# Patient Record
Sex: Male | Born: 1955 | Race: White | Hispanic: No | Marital: Married | Smoking: Current every day smoker
Health system: Southern US, Community
[De-identification: ages and names within clinical notes are randomized; demographics above are authoritative.]

## PROBLEM LIST (undated history)

## (undated) DIAGNOSIS — T4145XA Adverse effect of unspecified anesthetic, initial encounter: Secondary | ICD-10-CM

## (undated) DIAGNOSIS — I2699 Other pulmonary embolism without acute cor pulmonale: Secondary | ICD-10-CM

## (undated) DIAGNOSIS — M1990 Unspecified osteoarthritis, unspecified site: Secondary | ICD-10-CM

## (undated) DIAGNOSIS — K219 Gastro-esophageal reflux disease without esophagitis: Secondary | ICD-10-CM

## (undated) DIAGNOSIS — O2230 Deep phlebothrombosis in pregnancy, unspecified trimester: Secondary | ICD-10-CM

## (undated) DIAGNOSIS — D649 Anemia, unspecified: Secondary | ICD-10-CM

## (undated) DIAGNOSIS — R079 Chest pain, unspecified: Secondary | ICD-10-CM

## (undated) DIAGNOSIS — E871 Hypo-osmolality and hyponatremia: Secondary | ICD-10-CM

## (undated) DIAGNOSIS — Z9289 Personal history of other medical treatment: Secondary | ICD-10-CM

## (undated) DIAGNOSIS — R55 Syncope and collapse: Secondary | ICD-10-CM

## (undated) DIAGNOSIS — G2581 Restless legs syndrome: Secondary | ICD-10-CM

## (undated) DIAGNOSIS — J45909 Unspecified asthma, uncomplicated: Secondary | ICD-10-CM

## (undated) DIAGNOSIS — N183 Chronic kidney disease, stage 3 unspecified: Secondary | ICD-10-CM

## (undated) DIAGNOSIS — R0602 Shortness of breath: Secondary | ICD-10-CM

## (undated) DIAGNOSIS — I10 Essential (primary) hypertension: Secondary | ICD-10-CM

## (undated) DIAGNOSIS — T8859XA Other complications of anesthesia, initial encounter: Secondary | ICD-10-CM

## (undated) DIAGNOSIS — J189 Pneumonia, unspecified organism: Secondary | ICD-10-CM

## (undated) HISTORY — DX: Hypo-osmolality and hyponatremia: E87.1

## (undated) HISTORY — DX: Syncope and collapse: R55

## (undated) HISTORY — DX: Deep phlebothrombosis in pregnancy, unspecified trimester: O22.30

## (undated) HISTORY — DX: Chronic kidney disease, stage 3 unspecified: N18.30

## (undated) HISTORY — DX: Chronic kidney disease, stage 3 (moderate): N18.3

## (undated) HISTORY — DX: Chest pain, unspecified: R07.9

## (undated) HISTORY — DX: Restless legs syndrome: G25.81

## (undated) HISTORY — PX: KNEE ARTHROSCOPY: SUR90

## (undated) HISTORY — DX: Anemia, unspecified: D64.9

---

## 2005-01-05 ENCOUNTER — Ambulatory Visit (HOSPITAL_COMMUNITY): Admission: RE | Admit: 2005-01-05 | Discharge: 2005-01-05 | Payer: Self-pay | Admitting: Preventative Medicine

## 2005-01-06 ENCOUNTER — Ambulatory Visit (HOSPITAL_COMMUNITY): Admission: RE | Admit: 2005-01-06 | Discharge: 2005-01-06 | Payer: Self-pay | Admitting: Preventative Medicine

## 2005-01-09 ENCOUNTER — Ambulatory Visit (HOSPITAL_COMMUNITY): Admission: RE | Admit: 2005-01-09 | Discharge: 2005-01-09 | Payer: Self-pay | Admitting: Preventative Medicine

## 2011-02-27 ENCOUNTER — Encounter: Payer: Self-pay | Admitting: *Deleted

## 2011-02-27 ENCOUNTER — Emergency Department (HOSPITAL_COMMUNITY)
Admission: EM | Admit: 2011-02-27 | Discharge: 2011-02-27 | Disposition: A | Discharge status: Home / Self Care | Payer: BC Managed Care – PPO | Attending: Emergency Medicine | Admitting: Emergency Medicine

## 2011-02-27 DIAGNOSIS — IMO0001 Reserved for inherently not codable concepts without codable children: Secondary | ICD-10-CM

## 2011-02-27 DIAGNOSIS — Z7982 Long term (current) use of aspirin: Secondary | ICD-10-CM | POA: Insufficient documentation

## 2011-02-27 DIAGNOSIS — L500 Allergic urticaria: Secondary | ICD-10-CM | POA: Insufficient documentation

## 2011-02-27 DIAGNOSIS — F17200 Nicotine dependence, unspecified, uncomplicated: Secondary | ICD-10-CM | POA: Insufficient documentation

## 2011-02-27 HISTORY — DX: Gastro-esophageal reflux disease without esophagitis: K21.9

## 2011-02-27 MED ORDER — PREDNISONE 10 MG PO TABS: 20.0000 mg | ORAL_TABLET | Freq: Every day | ORAL | Status: AC

## 2011-02-27 MED ORDER — ALBUTEROL SULFATE (5 MG/ML) 0.5% IN NEBU
5.0000 mg | INHALATION_SOLUTION | Freq: Once | RESPIRATORY_TRACT | Status: AC
  Administered 2011-02-27: 5 mg via RESPIRATORY_TRACT
  Filled 2011-02-27: qty 1

## 2011-02-27 MED ORDER — DIPHENHYDRAMINE HCL 50 MG/ML IJ SOLN
INTRAMUSCULAR | Status: AC
  Administered 2011-02-27: 50 mg via INTRAVENOUS
  Filled 2011-02-27: qty 1

## 2011-02-27 MED ORDER — SODIUM CHLORIDE 0.9 % IN NEBU
INHALATION_SOLUTION | RESPIRATORY_TRACT | Status: AC
  Administered 2011-02-27: 3 mL
  Filled 2011-02-27: qty 3

## 2011-02-27 MED ORDER — EPINEPHRINE HCL 1 MG/ML IJ SOLN
INTRAMUSCULAR | Status: AC
  Administered 2011-02-27: 0.3 mg via SUBCUTANEOUS
  Filled 2011-02-27: qty 1

## 2011-02-27 MED ORDER — METHYLPREDNISOLONE SODIUM SUCC 125 MG IJ SOLR
INTRAMUSCULAR | Status: AC
  Administered 2011-02-27: 125 mg via INTRAVENOUS
  Filled 2011-02-27: qty 2

## 2011-02-27 MED ORDER — FAMOTIDINE IN NACL 20-0.9 MG/50ML-% IV SOLN
INTRAVENOUS | Status: AC
  Administered 2011-02-27: 02:00:00 via INTRAVENOUS
  Filled 2011-02-27: qty 50

## 2011-02-27 NOTE — ED Provider Notes (Signed)
History     CSN: [id number] Arrival date & time: 02/27/2011  1:16 AM  Chief Complaint  Patient presents with  . Allergic Reaction   HPI Comments: Seen 0127  Patient is a 55 y.o. male presenting with allergic reaction. The history is provided by the patient.  Allergic Reaction The primary symptoms are  wheezing, shortness of breath, rash and urticaria. The current episode started less than 1 hour ago. The problem has not changed since onset. The patient's medical history does not include asthma, COPD or chronic lung disease.  The shortness of breath developed suddenly. The shortness of breath is moderate. The patient's medical history does not include CHF, COPD, asthma or chronic lung disease.  The urticaria began less than 1 hour ago. The urticaria has been gradually worsening since its onset. Urticaria is located on the face, neck, back, chest, left arm, right arm, right leg and left leg.  Associated with: no known exposure or cause.    Past Medical History  Diagnosis Date  . Acid reflux     Past Surgical History  Procedure Date  . Knee arthroscopy     No family history on file.  History  Substance Use Topics  . Smoking status: Current Everyday Smoker    Types: Cigarettes  . Smokeless tobacco: Not on file  . Alcohol Use: No      Review of Systems  Respiratory: Positive for shortness of breath and wheezing.   Skin: Positive for rash.  All other systems reviewed and are negative.    Physical Exam  BP 155/74  Pulse 80  Temp(Src) 98 F (36.7 C) (Oral)  Resp 20  Ht 5\' 10"  (1.778 m)  Wt 212 lb (96.163 kg)  BMI 30.42 kg/m2  SpO2 96%  Physical Exam  Nursing note and vitals reviewed. Constitutional: He is oriented to person, place, and time. He appears well-developed and well-nourished. He appears distressed.  HENT:  Head: Normocephalic and atraumatic.  Eyes: EOM are normal. Pupils are equal, round, and reactive to light.  Neck: Normal range of motion. Neck  supple. No tracheal deviation present.  Cardiovascular: Normal rate, normal heart sounds and intact distal pulses.   Pulmonary/Chest: Effort normal. He has wheezes.  Abdominal: Soft.  Musculoskeletal: Normal range of motion.  Lymphadenopathy:    He has no cervical adenopathy.  Neurological: He is alert and oriented to person, place, and time.  Skin:       Hives to body, with rash, body wide    ED Course  Procedures  MDM  Patient received benedryl, solumedrol, pepcid, and epinephrine upon arrival. He was given albuterol for mild wheezing on his exam. Hives have resolved, subjective feeling of shortness of breah has resolved. Itching has resolved. Reviewed again possible causes/ exposures. He still does not have any recall of unusual exposures/ ingestions, new products.      Nicoletta Dress. Colon Branch, MD 02/27/11 [phone]

## 2011-02-27 NOTE — ED Notes (Signed)
IV d/c'd; catheter intact and site WNL.  Discharge instructions given and reviewed with patient.  Prescription given for Prednisone.  Patient verbalized understanding to take medication as prescribed and to f/u with PMD as needed.  Patient ambulatory with a steady gait; discharged home in good condition.

## 2011-02-27 NOTE — ED Notes (Signed)
Patient states he feels much better, states he is breathing better and now has no itching or burning.

## 2011-07-25 DIAGNOSIS — I2699 Other pulmonary embolism without acute cor pulmonale: Secondary | ICD-10-CM

## 2011-07-25 HISTORY — DX: Other pulmonary embolism without acute cor pulmonale: I26.99

## 2012-01-10 DIAGNOSIS — I8310 Varicose veins of unspecified lower extremity with inflammation: Secondary | ICD-10-CM | POA: Insufficient documentation

## 2012-02-21 ENCOUNTER — Telehealth: Payer: Self-pay

## 2012-02-21 NOTE — Telephone Encounter (Signed)
LMOM for a return call.  

## 2012-02-26 NOTE — Telephone Encounter (Signed)
LMOM to call.

## 2012-02-27 NOTE — Telephone Encounter (Signed)
Letter to pt and PCP.  

## 2012-03-12 ENCOUNTER — Other Ambulatory Visit: Payer: Self-pay

## 2012-03-12 ENCOUNTER — Telehealth: Payer: Self-pay

## 2012-03-12 DIAGNOSIS — Z139 Encounter for screening, unspecified: Secondary | ICD-10-CM

## 2012-03-12 NOTE — Telephone Encounter (Signed)
MOVI PREP SPLIT DOSING, REGULAR BREAKFAST. CLEAR LIQUIDS AFTER 9 AM.  

## 2012-03-12 NOTE — Telephone Encounter (Signed)
Gastroenterology Pre-Procedure Form  Pt's wife gave the info for the triage   Request Date: 03/11/2012     Requesting Physician: Dr. Felecia Shelling     PATIENT INFORMATION:  James Cabrera is a 56 y.o., male (DOB=1956-01-21).  PROCEDURE: Procedure(s) requested: colonoscopy Procedure Reason: screening for colon cancer  PATIENT REVIEW QUESTIONS: The patient reports the following:   1. Diabetes Melitis: no 2. Joint replacements in the past 12 months: no 3. Major health problems in the past 3 months: no 4. Has an artificial valve or MVP:no 5. Has been advised in past to take antibiotics in advance of a procedure like teeth cleaning: no}    MEDICATIONS & ALLERGIES:    Patient reports the following regarding taking any blood thinners:   Plavix? no Aspirin?yes  Coumadin?  no  Patient confirms/reports the following medications:  Current Outpatient Prescriptions  Medication Sig Dispense Refill  . aspirin 81 MG tablet Take 81 mg by mouth daily.        Marland Kitchen losartan-hydrochlorothiazide (HYZAAR) 100-25 MG per tablet Take 1 tablet by mouth daily.      . traMADol-acetaminophen (ULTRACET) 37.5-325 MG per tablet Take 1 tablet by mouth 2 (two) times daily.        Patient confirms/reports the following allergies:  Allergies  Allergen Reactions  . Penicillins     ALMOST DIED/ COULDN'T REMEMBER EXACT PROBLEM    Patient is appropriate to schedule for requested procedure(s): yes  AUTHORIZATION INFORMATION Primary Insurance:   ID #:  Group #:  Pre-Cert / Auth required: Pre-Cert / Auth #:   Secondary Insurance:   ID #:   Group #:  Pre-Cert / Auth required:  Pre-Cert / Auth #:   No orders of the defined types were placed in this encounter.    SCHEDULE INFORMATION: Procedure has been scheduled as follows:  Date: 04/12/2012    Time: 1:15 PM  Location: [facility] Short Stay  This Gastroenterology Pre-Precedure Form is being routed to the following provider(s) for review: Jonette Eva,  MD

## 2012-03-13 MED ORDER — PEG-KCL-NACL-NASULF-NA ASC-C 100 G PO SOLR: 1.0000 | ORAL | Status: DC

## 2012-03-13 NOTE — Telephone Encounter (Signed)
Rx sent to [facility]. Instructions mailed to pt.

## 2012-03-28 ENCOUNTER — Telehealth: Payer: Self-pay | Admitting: Gastroenterology

## 2012-03-28 NOTE — Telephone Encounter (Signed)
Pt's wife called and said that someone had called yesterday and LM about needing the patients medications. Wife said she had went over the medicines during triage and thought maybe it was regarding his prep. Please return their call to [phone] Pt is scheduled for 9/20

## 2012-03-28 NOTE — Telephone Encounter (Signed)
Spoke with pt's wife. She is aware Rx sent to pharmacy.

## 2012-04-08 ENCOUNTER — Telehealth: Payer: Self-pay

## 2012-04-08 NOTE — Telephone Encounter (Signed)
Pt's wife called to cancel his colonoscopy on Friday 04/12/2012 with Dr. Darrick Penna. He is in [facility] with a blood clot in his lung. They will call back to reschedule when he is better.

## 2012-04-08 NOTE — Telephone Encounter (Signed)
Selena Batten was informed in Endo.

## 2012-04-08 NOTE — Telephone Encounter (Signed)
REVIEWED.  

## 2012-04-12 ENCOUNTER — Ambulatory Visit (HOSPITAL_COMMUNITY)
Admission: RE | Admit: 2012-04-12 | Payer: BC Managed Care – PPO | Source: Ambulatory Visit | Admitting: Gastroenterology

## 2012-04-12 ENCOUNTER — Encounter (HOSPITAL_COMMUNITY): Admission: RE | Payer: Self-pay | Source: Ambulatory Visit

## 2012-04-12 SURGERY — COLONOSCOPY
Anesthesia: Moderate Sedation

## 2012-04-15 LAB — PROTIME-INR

## 2012-04-17 ENCOUNTER — Ambulatory Visit (INDEPENDENT_AMBULATORY_CARE_PROVIDER_SITE_OTHER): Payer: BC Managed Care – PPO | Admitting: *Deleted

## 2012-04-17 DIAGNOSIS — I2699 Other pulmonary embolism without acute cor pulmonale: Secondary | ICD-10-CM

## 2012-04-17 DIAGNOSIS — Z7901 Long term (current) use of anticoagulants: Secondary | ICD-10-CM | POA: Insufficient documentation

## 2012-04-17 DIAGNOSIS — I4891 Unspecified atrial fibrillation: Secondary | ICD-10-CM

## 2012-04-17 LAB — POCT INR: INR: 4.2

## 2012-04-22 ENCOUNTER — Ambulatory Visit (INDEPENDENT_AMBULATORY_CARE_PROVIDER_SITE_OTHER): Payer: BC Managed Care – PPO | Admitting: *Deleted

## 2012-04-22 DIAGNOSIS — I4891 Unspecified atrial fibrillation: Secondary | ICD-10-CM

## 2012-04-22 DIAGNOSIS — I2699 Other pulmonary embolism without acute cor pulmonale: Secondary | ICD-10-CM

## 2012-04-22 DIAGNOSIS — Z7901 Long term (current) use of anticoagulants: Secondary | ICD-10-CM

## 2012-04-25 ENCOUNTER — Other Ambulatory Visit: Payer: Self-pay | Admitting: Internal Medicine

## 2012-04-25 MED ORDER — WARFARIN SODIUM 5 MG PO TABS: 5.0000 mg | ORAL_TABLET | ORAL | Status: DC

## 2012-04-25 NOTE — Telephone Encounter (Signed)
PT NEEDS IT TODAY TO [facility] ON [address]

## 2012-04-29 ENCOUNTER — Ambulatory Visit (INDEPENDENT_AMBULATORY_CARE_PROVIDER_SITE_OTHER): Payer: BC Managed Care – PPO | Admitting: *Deleted

## 2012-04-29 ENCOUNTER — Ambulatory Visit (INDEPENDENT_AMBULATORY_CARE_PROVIDER_SITE_OTHER): Payer: BC Managed Care – PPO | Admitting: Internal Medicine

## 2012-04-29 ENCOUNTER — Encounter: Payer: Self-pay | Admitting: Internal Medicine

## 2012-04-29 VITALS — BP 120/78 | HR 83 | Ht 70.0 in | Wt 227.0 lb

## 2012-04-29 DIAGNOSIS — I2699 Other pulmonary embolism without acute cor pulmonale: Secondary | ICD-10-CM

## 2012-04-29 DIAGNOSIS — Z7901 Long term (current) use of anticoagulants: Secondary | ICD-10-CM

## 2012-04-29 DIAGNOSIS — I4891 Unspecified atrial fibrillation: Secondary | ICD-10-CM

## 2012-04-29 LAB — POCT INR: INR: 2.5

## 2012-04-29 NOTE — Progress Notes (Signed)
HPI Patien,t is a 56 year old who follows with Dr. Felecia Shelling.  History of HTN and also DVT in the past.  He was followed by Dewayne Shorter but since he retired he is now  Followed by Dr. Josem Kaufmann at [facility]) for varicose veins. IN mid September the patient developed severe R sided pleuritic CP. Work up signif for PE.  V/Q scan with large defect in RLL and mild to moderate sized defect on L   Patient treated with anticoag as well as empiric ABX  Patient says he is still SOB  Occasional R sided pain that is pleuritic  Mild  Exercising a little on a bike.   Allergies  Allergen Reactions  . Penicillins     ALMOST DIED/ COULDN'T REMEMBER EXACT PROBLEM    Current Outpatient Prescriptions  Medication Sig Dispense Refill  . aspirin 81 MG tablet Take 81 mg by mouth daily.        Marland Kitchen losartan-hydrochlorothiazide (HYZAAR) 100-25 MG per tablet Take 1 tablet by mouth daily.      Marland Kitchen warfarin (COUMADIN) 5 MG tablet Take 1 tablet (5 mg total) by mouth as directed. Taking 2 1/2 tablets daily  30 tablet  2    Past Medical History  Diagnosis Date  . Acid reflux     Past Surgical History  Procedure Date  . Knee arthroscopy     No family history on file.  History   Social History  . Marital Status: Married    Spouse Name: N/A    Number of Children: N/A  . Years of Education: N/A   Occupational History  . Not on file.   Social History Main Topics  . Smoking status: Current Every Day Smoker -- 37 years    Types: Cigarettes  . Smokeless tobacco: Not on file   Comment: 4 cigarettes daily for the past 3 days  . Alcohol Use: No  . Drug Use: No  . Sexually Active: Not on file   Other Topics Concern  . Not on file   Social History Narrative  . No narrative on file    Review of Systems:  All systems reviewed.  They are negative to the above problem except as previously stated.  Vital Signs: BP 120/78  Pulse 83  Ht 5\' 10"  (1.778 m)  Wt 272 lb 6.4 oz (123.56 kg)  BMI 39.09  kg/m2  Physical Exam Patient is in NAD HEENT:  Normocephalic, atraumatic. EOMI, PERRLA.  Neck: JVP is normal.  No bruits.  Lungs: clear to auscultation. No rales no wheezes.  Heart: Regular rate and rhythm. Normal S1, S2. No S3.   No significant murmurs. PMI not displaced.  Abdomen:  Supple, nontender. Normal bowel sounds. No masses. No hepatomegaly.  Extremities:   Good distal pulses throughout. No lower extremity edema.  Musculoskeletal :moving all extremities.  Neuro:   alert and oriented x3.  CN II-XII grossly intact.  EKG:  SR  83  Occasional PVC.  Assessment and Plan:  1.  Pulmonary embolism.  Patient with recent event.  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 Will need life long anticoagulation  With cross bridging as needed.   SOB will take time to improve. 2.  HTN  Adequate control.    Patient to f/u yearly.  F/U Coumadin.  F/U Dr Felecia Shelling  F/U at [facility]

## 2012-04-29 NOTE — Patient Instructions (Addendum)
Your physician recommends that you schedule a follow-up appointment in: 1 year  

## 2012-05-13 ENCOUNTER — Ambulatory Visit (INDEPENDENT_AMBULATORY_CARE_PROVIDER_SITE_OTHER): Payer: BC Managed Care – PPO | Admitting: *Deleted

## 2012-05-13 DIAGNOSIS — I4891 Unspecified atrial fibrillation: Secondary | ICD-10-CM

## 2012-05-13 DIAGNOSIS — Z7901 Long term (current) use of anticoagulants: Secondary | ICD-10-CM

## 2012-05-13 DIAGNOSIS — I2699 Other pulmonary embolism without acute cor pulmonale: Secondary | ICD-10-CM

## 2012-05-20 ENCOUNTER — Telehealth: Payer: Self-pay | Admitting: *Deleted

## 2012-05-20 NOTE — Telephone Encounter (Signed)
LMOM to call. If on coumadin needs OV first.

## 2012-05-20 NOTE — Telephone Encounter (Signed)
Ms Stoklosa called today to set up her husbands colonoscopy appt. Please call her back. Thanks.

## 2012-05-20 NOTE — Telephone Encounter (Signed)
Pt called and said he is on coumadin and he is having a time getting that regulated. He had blood clot in his lung and pneumonia. York Spaniel he has not recoved from that yet. He wants to wait a few weeks and give me a call back.

## 2012-05-23 ENCOUNTER — Ambulatory Visit (INDEPENDENT_AMBULATORY_CARE_PROVIDER_SITE_OTHER): Payer: BC Managed Care – PPO | Admitting: *Deleted

## 2012-05-23 DIAGNOSIS — I2699 Other pulmonary embolism without acute cor pulmonale: Secondary | ICD-10-CM

## 2012-05-23 DIAGNOSIS — Z7901 Long term (current) use of anticoagulants: Secondary | ICD-10-CM

## 2012-05-23 DIAGNOSIS — I4891 Unspecified atrial fibrillation: Secondary | ICD-10-CM

## 2012-05-23 LAB — POCT INR: INR: 1.7

## 2012-06-10 ENCOUNTER — Ambulatory Visit (INDEPENDENT_AMBULATORY_CARE_PROVIDER_SITE_OTHER): Payer: BC Managed Care – PPO | Admitting: *Deleted

## 2012-06-10 DIAGNOSIS — Z7901 Long term (current) use of anticoagulants: Secondary | ICD-10-CM

## 2012-06-10 DIAGNOSIS — I4891 Unspecified atrial fibrillation: Secondary | ICD-10-CM

## 2012-06-10 DIAGNOSIS — I2699 Other pulmonary embolism without acute cor pulmonale: Secondary | ICD-10-CM

## 2012-06-10 LAB — POCT INR: INR: 2.8

## 2012-06-10 NOTE — Telephone Encounter (Signed)
Letter to pt to call when he is ready.

## 2012-06-19 DIAGNOSIS — I82412 Acute embolism and thrombosis of left femoral vein: Secondary | ICD-10-CM | POA: Insufficient documentation

## 2012-06-19 DIAGNOSIS — I82409 Acute embolism and thrombosis of unspecified deep veins of unspecified lower extremity: Secondary | ICD-10-CM | POA: Insufficient documentation

## 2012-06-19 DIAGNOSIS — Z86711 Personal history of pulmonary embolism: Secondary | ICD-10-CM | POA: Insufficient documentation

## 2012-06-28 ENCOUNTER — Other Ambulatory Visit: Payer: Self-pay | Admitting: Cardiology

## 2012-06-28 MED ORDER — WARFARIN SODIUM 5 MG PO TABS: 5.0000 mg | ORAL_TABLET | ORAL | Status: DC

## 2012-07-01 ENCOUNTER — Ambulatory Visit (INDEPENDENT_AMBULATORY_CARE_PROVIDER_SITE_OTHER): Payer: BC Managed Care – PPO | Admitting: *Deleted

## 2012-07-01 DIAGNOSIS — I4891 Unspecified atrial fibrillation: Secondary | ICD-10-CM

## 2012-07-01 DIAGNOSIS — I2699 Other pulmonary embolism without acute cor pulmonale: Secondary | ICD-10-CM

## 2012-07-01 DIAGNOSIS — Z7901 Long term (current) use of anticoagulants: Secondary | ICD-10-CM

## 2012-07-29 ENCOUNTER — Ambulatory Visit (INDEPENDENT_AMBULATORY_CARE_PROVIDER_SITE_OTHER): Payer: BC Managed Care – PPO | Admitting: *Deleted

## 2012-07-29 DIAGNOSIS — I4891 Unspecified atrial fibrillation: Secondary | ICD-10-CM

## 2012-07-29 DIAGNOSIS — I2699 Other pulmonary embolism without acute cor pulmonale: Secondary | ICD-10-CM

## 2012-07-29 DIAGNOSIS — Z7901 Long term (current) use of anticoagulants: Secondary | ICD-10-CM

## 2012-08-26 ENCOUNTER — Ambulatory Visit (INDEPENDENT_AMBULATORY_CARE_PROVIDER_SITE_OTHER): Payer: BC Managed Care – PPO | Admitting: *Deleted

## 2012-08-26 DIAGNOSIS — I2699 Other pulmonary embolism without acute cor pulmonale: Secondary | ICD-10-CM

## 2012-08-26 DIAGNOSIS — I4891 Unspecified atrial fibrillation: Secondary | ICD-10-CM

## 2012-08-26 DIAGNOSIS — Z7901 Long term (current) use of anticoagulants: Secondary | ICD-10-CM

## 2012-09-23 ENCOUNTER — Ambulatory Visit (INDEPENDENT_AMBULATORY_CARE_PROVIDER_SITE_OTHER): Payer: BC Managed Care – PPO | Admitting: *Deleted

## 2012-09-23 ENCOUNTER — Telehealth: Payer: Self-pay | Admitting: *Deleted

## 2012-09-23 DIAGNOSIS — Z7901 Long term (current) use of anticoagulants: Secondary | ICD-10-CM

## 2012-09-23 DIAGNOSIS — I4891 Unspecified atrial fibrillation: Secondary | ICD-10-CM

## 2012-09-23 DIAGNOSIS — I2699 Other pulmonary embolism without acute cor pulmonale: Secondary | ICD-10-CM

## 2012-09-23 NOTE — Telephone Encounter (Signed)
Pt states lisa wanted to know what medication he was put on. Atorvastatin 20mg .

## 2012-09-23 NOTE — Telephone Encounter (Signed)
Atorvastain added to medicine list.

## 2012-10-10 ENCOUNTER — Ambulatory Visit (INDEPENDENT_AMBULATORY_CARE_PROVIDER_SITE_OTHER): Payer: BC Managed Care – PPO | Admitting: *Deleted

## 2012-10-10 DIAGNOSIS — I4891 Unspecified atrial fibrillation: Secondary | ICD-10-CM

## 2012-10-10 DIAGNOSIS — I2699 Other pulmonary embolism without acute cor pulmonale: Secondary | ICD-10-CM

## 2012-10-10 DIAGNOSIS — Z7901 Long term (current) use of anticoagulants: Secondary | ICD-10-CM

## 2012-10-28 ENCOUNTER — Ambulatory Visit (INDEPENDENT_AMBULATORY_CARE_PROVIDER_SITE_OTHER): Payer: BC Managed Care – PPO | Admitting: *Deleted

## 2012-10-28 DIAGNOSIS — I4891 Unspecified atrial fibrillation: Secondary | ICD-10-CM

## 2012-10-28 DIAGNOSIS — Z7901 Long term (current) use of anticoagulants: Secondary | ICD-10-CM

## 2012-10-28 DIAGNOSIS — I2699 Other pulmonary embolism without acute cor pulmonale: Secondary | ICD-10-CM

## 2012-10-28 LAB — POCT INR: INR: 2.6

## 2012-11-18 ENCOUNTER — Other Ambulatory Visit (HOSPITAL_COMMUNITY): Payer: Self-pay | Admitting: Internal Medicine

## 2012-11-18 ENCOUNTER — Ambulatory Visit (HOSPITAL_COMMUNITY)
Admission: RE | Admit: 2012-11-18 | Discharge: 2012-11-18 | Disposition: A | Discharge status: Home / Self Care | Payer: BC Managed Care – PPO | Source: Ambulatory Visit | Attending: Internal Medicine | Admitting: Internal Medicine

## 2012-11-18 DIAGNOSIS — R52 Pain, unspecified: Secondary | ICD-10-CM

## 2012-11-18 DIAGNOSIS — M51379 Other intervertebral disc degeneration, lumbosacral region without mention of lumbar back pain or lower extremity pain: Secondary | ICD-10-CM | POA: Insufficient documentation

## 2012-11-18 DIAGNOSIS — M545 Low back pain, unspecified: Secondary | ICD-10-CM | POA: Insufficient documentation

## 2012-11-18 DIAGNOSIS — G8929 Other chronic pain: Secondary | ICD-10-CM | POA: Insufficient documentation

## 2012-11-18 DIAGNOSIS — M25559 Pain in unspecified hip: Secondary | ICD-10-CM | POA: Insufficient documentation

## 2012-11-18 DIAGNOSIS — M5137 Other intervertebral disc degeneration, lumbosacral region: Secondary | ICD-10-CM | POA: Insufficient documentation

## 2012-11-20 ENCOUNTER — Telehealth: Payer: Self-pay | Admitting: Internal Medicine

## 2012-11-20 MED ORDER — WARFARIN SODIUM 5 MG PO TABS: 5.0000 mg | ORAL_TABLET | ORAL | Status: DC

## 2012-11-20 NOTE — Telephone Encounter (Signed)
rx sent to pharmacy by e-script Pt aware 

## 2012-11-20 NOTE — Telephone Encounter (Signed)
PT IS COMPLETELY OUT OF COUMADIN

## 2012-11-25 ENCOUNTER — Ambulatory Visit (INDEPENDENT_AMBULATORY_CARE_PROVIDER_SITE_OTHER): Payer: BC Managed Care – PPO | Admitting: *Deleted

## 2012-11-25 DIAGNOSIS — I2699 Other pulmonary embolism without acute cor pulmonale: Secondary | ICD-10-CM

## 2012-11-25 DIAGNOSIS — I4891 Unspecified atrial fibrillation: Secondary | ICD-10-CM

## 2012-11-25 DIAGNOSIS — Z7901 Long term (current) use of anticoagulants: Secondary | ICD-10-CM

## 2012-12-23 ENCOUNTER — Ambulatory Visit (INDEPENDENT_AMBULATORY_CARE_PROVIDER_SITE_OTHER): Payer: BC Managed Care – PPO | Admitting: *Deleted

## 2012-12-23 DIAGNOSIS — I4891 Unspecified atrial fibrillation: Secondary | ICD-10-CM

## 2012-12-23 DIAGNOSIS — I2699 Other pulmonary embolism without acute cor pulmonale: Secondary | ICD-10-CM

## 2012-12-23 DIAGNOSIS — Z7901 Long term (current) use of anticoagulants: Secondary | ICD-10-CM

## 2013-01-09 ENCOUNTER — Ambulatory Visit (INDEPENDENT_AMBULATORY_CARE_PROVIDER_SITE_OTHER): Payer: BC Managed Care – PPO | Admitting: *Deleted

## 2013-01-09 DIAGNOSIS — I4891 Unspecified atrial fibrillation: Secondary | ICD-10-CM

## 2013-01-09 DIAGNOSIS — I2699 Other pulmonary embolism without acute cor pulmonale: Secondary | ICD-10-CM

## 2013-01-09 DIAGNOSIS — Z7901 Long term (current) use of anticoagulants: Secondary | ICD-10-CM

## 2013-01-09 LAB — POCT INR: INR: 1.8

## 2013-01-13 ENCOUNTER — Other Ambulatory Visit (HOSPITAL_COMMUNITY): Payer: Self-pay | Admitting: Neurosurgery

## 2013-01-13 DIAGNOSIS — M549 Dorsalgia, unspecified: Secondary | ICD-10-CM

## 2013-01-15 ENCOUNTER — Ambulatory Visit (HOSPITAL_COMMUNITY)
Admission: RE | Admit: 2013-01-15 | Discharge: 2013-01-15 | Disposition: A | Discharge status: Home / Self Care | Payer: BC Managed Care – PPO | Source: Ambulatory Visit | Attending: Neurosurgery | Admitting: Neurosurgery

## 2013-01-15 DIAGNOSIS — M549 Dorsalgia, unspecified: Secondary | ICD-10-CM

## 2013-01-27 ENCOUNTER — Other Ambulatory Visit: Payer: Self-pay | Admitting: Neurosurgery

## 2013-01-27 ENCOUNTER — Ambulatory Visit (INDEPENDENT_AMBULATORY_CARE_PROVIDER_SITE_OTHER): Payer: BC Managed Care – PPO | Admitting: *Deleted

## 2013-01-27 DIAGNOSIS — I2699 Other pulmonary embolism without acute cor pulmonale: Secondary | ICD-10-CM

## 2013-01-27 DIAGNOSIS — Z7901 Long term (current) use of anticoagulants: Secondary | ICD-10-CM

## 2013-01-27 DIAGNOSIS — I4891 Unspecified atrial fibrillation: Secondary | ICD-10-CM

## 2013-01-27 DIAGNOSIS — M549 Dorsalgia, unspecified: Secondary | ICD-10-CM

## 2013-01-27 LAB — POCT INR: INR: 2.4

## 2013-01-30 ENCOUNTER — Telehealth: Payer: Self-pay | Admitting: *Deleted

## 2013-01-30 NOTE — Telephone Encounter (Signed)
Needs ok to stop Coumadin 4 days prior to Myelogram. Has not been scheduled. /t gs

## 2013-01-30 NOTE — Telephone Encounter (Signed)
Called and left message for Duwayne Heck at [facility] that pt will need to be bridged with Lovenox when he comes off coumadin.  Let me know surgical date if we need to assist with bridging.

## 2013-02-05 ENCOUNTER — Ambulatory Visit (INDEPENDENT_AMBULATORY_CARE_PROVIDER_SITE_OTHER): Payer: BC Managed Care – PPO | Admitting: *Deleted

## 2013-02-05 DIAGNOSIS — I2699 Other pulmonary embolism without acute cor pulmonale: Secondary | ICD-10-CM

## 2013-02-05 DIAGNOSIS — I4891 Unspecified atrial fibrillation: Secondary | ICD-10-CM

## 2013-02-05 DIAGNOSIS — Z7901 Long term (current) use of anticoagulants: Secondary | ICD-10-CM

## 2013-02-05 LAB — POCT INR: INR: 2.4

## 2013-02-05 MED ORDER — ENOXAPARIN SODIUM 100 MG/ML ~~LOC~~ SOLN: 100.0000 mg | Freq: Two times a day (BID) | SUBCUTANEOUS | Status: DC

## 2013-02-05 NOTE — Patient Instructions (Signed)
7/15  Last dose of coumadin 7/16  No Lovenox or coumadin 7/17  lovenox 100mg  sq 8am and 8pm 7/18   lovenox 100mg  sq 8am and 8pm 7/19   lovenox 100mg  sq 8am and 8pm 7/20   lovenox 100mg  sq 8am only  No lovenox in pm 7/21  No lovenox ---procedure---coumadin 7.5mg  pm 7/22   lovenox 100mg  sq 8am and 8pm & Coumadin 7.5mg  pm 7/23   lovenox 100mg  sq 8am and 8pm & Coumadin 7.5mg  pm 7/24   lovenox 100mg  sq 8am and INR appt at 10:50am   Lovenox 100mg  syringes  #10 sent to [facility].  Pt has 6 syringes at home left from hospital D/C

## 2013-02-10 ENCOUNTER — Ambulatory Visit
Admission: RE | Admit: 2013-02-10 | Discharge: 2013-02-10 | Disposition: A | Discharge status: Home / Self Care | Payer: BC Managed Care – PPO | Source: Ambulatory Visit | Attending: Neurosurgery | Admitting: Neurosurgery

## 2013-02-10 ENCOUNTER — Ambulatory Visit (INDEPENDENT_AMBULATORY_CARE_PROVIDER_SITE_OTHER): Payer: BC Managed Care – PPO | Admitting: *Deleted

## 2013-02-10 VITALS — BP 137/73 | HR 79

## 2013-02-10 DIAGNOSIS — I4891 Unspecified atrial fibrillation: Secondary | ICD-10-CM

## 2013-02-10 DIAGNOSIS — M549 Dorsalgia, unspecified: Secondary | ICD-10-CM

## 2013-02-10 DIAGNOSIS — I2699 Other pulmonary embolism without acute cor pulmonale: Secondary | ICD-10-CM

## 2013-02-10 DIAGNOSIS — Z7901 Long term (current) use of anticoagulants: Secondary | ICD-10-CM

## 2013-02-10 MED ORDER — DIAZEPAM 5 MG PO TABS
10.0000 mg | ORAL_TABLET | Freq: Once | ORAL | Status: AC
  Administered 2013-02-10: 10 mg via ORAL

## 2013-02-10 MED ORDER — MEPERIDINE HCL 100 MG/ML IJ SOLN
100.0000 mg | Freq: Once | INTRAMUSCULAR | Status: AC
  Administered 2013-02-10: 100 mg via INTRAMUSCULAR

## 2013-02-10 MED ORDER — ONDANSETRON HCL 4 MG/2ML IJ SOLN
4.0000 mg | Freq: Once | INTRAMUSCULAR | Status: AC
  Administered 2013-02-10: 4 mg via INTRAMUSCULAR

## 2013-02-10 MED ORDER — IOHEXOL 180 MG/ML  SOLN
20.0000 mL | Freq: Once | INTRAMUSCULAR | Status: AC | PRN
  Administered 2013-02-10: 20 mL via INTRATHECAL

## 2013-02-10 NOTE — Patient Instructions (Addendum)
02/10/2013 procedure when instructed to restart coumadin and lovenox  Take Lovenox 100 mg at 8am and 8pm each day and resume same coumadin dose except take extra 1/2 tablet for 2 days and continue lovenox and coumadin until seen in clinic on Thursday July 24th

## 2013-02-13 ENCOUNTER — Ambulatory Visit (INDEPENDENT_AMBULATORY_CARE_PROVIDER_SITE_OTHER): Payer: BC Managed Care – PPO | Admitting: *Deleted

## 2013-02-13 DIAGNOSIS — Z7901 Long term (current) use of anticoagulants: Secondary | ICD-10-CM

## 2013-02-13 DIAGNOSIS — I2699 Other pulmonary embolism without acute cor pulmonale: Secondary | ICD-10-CM

## 2013-02-13 DIAGNOSIS — I4891 Unspecified atrial fibrillation: Secondary | ICD-10-CM

## 2013-02-17 ENCOUNTER — Ambulatory Visit (INDEPENDENT_AMBULATORY_CARE_PROVIDER_SITE_OTHER): Payer: BC Managed Care – PPO | Admitting: *Deleted

## 2013-02-17 DIAGNOSIS — I2699 Other pulmonary embolism without acute cor pulmonale: Secondary | ICD-10-CM

## 2013-02-17 DIAGNOSIS — Z7901 Long term (current) use of anticoagulants: Secondary | ICD-10-CM

## 2013-02-17 DIAGNOSIS — I4891 Unspecified atrial fibrillation: Secondary | ICD-10-CM

## 2013-02-19 ENCOUNTER — Ambulatory Visit (INDEPENDENT_AMBULATORY_CARE_PROVIDER_SITE_OTHER): Payer: BC Managed Care – PPO | Admitting: *Deleted

## 2013-02-19 DIAGNOSIS — I4891 Unspecified atrial fibrillation: Secondary | ICD-10-CM

## 2013-02-19 DIAGNOSIS — I2699 Other pulmonary embolism without acute cor pulmonale: Secondary | ICD-10-CM

## 2013-02-19 DIAGNOSIS — Z7901 Long term (current) use of anticoagulants: Secondary | ICD-10-CM

## 2013-02-19 LAB — POCT INR: INR: 2.1

## 2013-02-20 ENCOUNTER — Encounter: Payer: Self-pay | Admitting: Internal Medicine

## 2013-03-06 ENCOUNTER — Other Ambulatory Visit (HOSPITAL_COMMUNITY): Payer: Self-pay | Admitting: Neurosurgery

## 2013-03-06 ENCOUNTER — Other Ambulatory Visit: Payer: Self-pay | Admitting: Neurosurgery

## 2013-03-06 DIAGNOSIS — M549 Dorsalgia, unspecified: Secondary | ICD-10-CM

## 2013-03-10 ENCOUNTER — Ambulatory Visit (INDEPENDENT_AMBULATORY_CARE_PROVIDER_SITE_OTHER): Payer: BC Managed Care – PPO | Admitting: *Deleted

## 2013-03-10 DIAGNOSIS — I2699 Other pulmonary embolism without acute cor pulmonale: Secondary | ICD-10-CM

## 2013-03-10 DIAGNOSIS — Z7901 Long term (current) use of anticoagulants: Secondary | ICD-10-CM

## 2013-03-10 DIAGNOSIS — I4891 Unspecified atrial fibrillation: Secondary | ICD-10-CM

## 2013-03-10 NOTE — Patient Instructions (Signed)
8/19  Last dose of coumadin 8/20  No lovenox or coumadin 8/21  Loveox 100mg  bid 8am & 8pm 8/22  Loveox 100mg  bid 8am & 8pm 8/23  Loveox 100mg  bid 8am & 8pm 8/24  Loveox 100mg  8am only 8/25  No lovenox---spinal injection---coumadin 10mg  pm 8/26 Loveox 100mg  bid 8am & 8pm & coumadin 10mg  pm 8/27  Loveox 100mg  bid 8am & 8pm & coumadin 10mg  pm 8/28  Lovenox 100mg  8am----INR appt @ 10:40am

## 2013-03-17 ENCOUNTER — Other Ambulatory Visit: Payer: Self-pay | Admitting: Neurosurgery

## 2013-03-17 ENCOUNTER — Ambulatory Visit (INDEPENDENT_AMBULATORY_CARE_PROVIDER_SITE_OTHER): Payer: BC Managed Care – PPO | Admitting: *Deleted

## 2013-03-17 ENCOUNTER — Ambulatory Visit
Admission: RE | Admit: 2013-03-17 | Discharge: 2013-03-17 | Disposition: A | Discharge status: Home / Self Care | Payer: BC Managed Care – PPO | Source: Ambulatory Visit | Attending: Neurosurgery | Admitting: Neurosurgery

## 2013-03-17 VITALS — BP 148/78 | HR 72

## 2013-03-17 DIAGNOSIS — I2699 Other pulmonary embolism without acute cor pulmonale: Secondary | ICD-10-CM

## 2013-03-17 DIAGNOSIS — M549 Dorsalgia, unspecified: Secondary | ICD-10-CM

## 2013-03-17 DIAGNOSIS — M5126 Other intervertebral disc displacement, lumbar region: Secondary | ICD-10-CM

## 2013-03-17 DIAGNOSIS — Z7901 Long term (current) use of anticoagulants: Secondary | ICD-10-CM

## 2013-03-17 DIAGNOSIS — I4891 Unspecified atrial fibrillation: Secondary | ICD-10-CM

## 2013-03-17 LAB — POCT INR: INR: 0.9

## 2013-03-17 MED ORDER — METHYLPREDNISOLONE ACETATE 40 MG/ML INJ SUSP (RADIOLOG
120.0000 mg | Freq: Once | INTRAMUSCULAR | Status: AC
  Administered 2013-03-17: 120 mg via INTRA_ARTICULAR

## 2013-03-17 MED ORDER — IOHEXOL 180 MG/ML  SOLN
1.0000 mL | Freq: Once | INTRAMUSCULAR | Status: AC | PRN
  Administered 2013-03-17: 2 mL via INTRA_ARTICULAR

## 2013-03-20 ENCOUNTER — Ambulatory Visit (INDEPENDENT_AMBULATORY_CARE_PROVIDER_SITE_OTHER): Payer: BC Managed Care – PPO | Admitting: *Deleted

## 2013-03-20 DIAGNOSIS — I2699 Other pulmonary embolism without acute cor pulmonale: Secondary | ICD-10-CM

## 2013-03-20 DIAGNOSIS — Z7901 Long term (current) use of anticoagulants: Secondary | ICD-10-CM

## 2013-03-20 DIAGNOSIS — I4891 Unspecified atrial fibrillation: Secondary | ICD-10-CM

## 2013-03-27 ENCOUNTER — Ambulatory Visit (INDEPENDENT_AMBULATORY_CARE_PROVIDER_SITE_OTHER): Payer: BC Managed Care – PPO | Admitting: *Deleted

## 2013-03-27 DIAGNOSIS — I2699 Other pulmonary embolism without acute cor pulmonale: Secondary | ICD-10-CM

## 2013-03-27 DIAGNOSIS — Z7901 Long term (current) use of anticoagulants: Secondary | ICD-10-CM

## 2013-03-27 DIAGNOSIS — I4891 Unspecified atrial fibrillation: Secondary | ICD-10-CM

## 2013-03-27 LAB — POCT INR: INR: 1.6

## 2013-03-31 ENCOUNTER — Ambulatory Visit (INDEPENDENT_AMBULATORY_CARE_PROVIDER_SITE_OTHER): Payer: BC Managed Care – PPO | Admitting: *Deleted

## 2013-03-31 DIAGNOSIS — I2699 Other pulmonary embolism without acute cor pulmonale: Secondary | ICD-10-CM

## 2013-03-31 DIAGNOSIS — I4891 Unspecified atrial fibrillation: Secondary | ICD-10-CM

## 2013-03-31 DIAGNOSIS — Z7901 Long term (current) use of anticoagulants: Secondary | ICD-10-CM

## 2013-03-31 LAB — POCT INR: INR: 2

## 2013-04-11 ENCOUNTER — Other Ambulatory Visit: Payer: Self-pay | Admitting: Neurosurgery

## 2013-04-11 DIAGNOSIS — M549 Dorsalgia, unspecified: Secondary | ICD-10-CM

## 2013-04-14 ENCOUNTER — Ambulatory Visit (INDEPENDENT_AMBULATORY_CARE_PROVIDER_SITE_OTHER): Payer: BC Managed Care – PPO | Admitting: *Deleted

## 2013-04-14 ENCOUNTER — Other Ambulatory Visit: Payer: Self-pay | Admitting: Internal Medicine

## 2013-04-14 DIAGNOSIS — I4891 Unspecified atrial fibrillation: Secondary | ICD-10-CM

## 2013-04-14 DIAGNOSIS — I2699 Other pulmonary embolism without acute cor pulmonale: Secondary | ICD-10-CM

## 2013-04-14 DIAGNOSIS — Z7901 Long term (current) use of anticoagulants: Secondary | ICD-10-CM

## 2013-04-14 LAB — BASIC METABOLIC PANEL
Calcium: 9.8 mg/dL (ref 8.4–10.5)
Glucose, Bld: 92 mg/dL (ref 70–99)
Potassium: 5 mEq/L (ref 3.5–5.3)
Sodium: 139 mEq/L (ref 135–145)

## 2013-04-14 LAB — CBC
Hemoglobin: 15.3 g/dL (ref 13.0–17.0)
MCH: 33.8 pg (ref 26.0–34.0)
MCHC: 33.6 g/dL (ref 30.0–36.0)
Platelets: 239 10*3/uL (ref 150–400)
RBC: 4.53 MIL/uL (ref 4.22–5.81)

## 2013-04-14 LAB — POCT INR: INR: 2

## 2013-04-18 ENCOUNTER — Ambulatory Visit (INDEPENDENT_AMBULATORY_CARE_PROVIDER_SITE_OTHER): Payer: BC Managed Care – PPO | Admitting: *Deleted

## 2013-04-18 DIAGNOSIS — Z7901 Long term (current) use of anticoagulants: Secondary | ICD-10-CM

## 2013-04-18 DIAGNOSIS — I2699 Other pulmonary embolism without acute cor pulmonale: Secondary | ICD-10-CM

## 2013-04-18 NOTE — Patient Instructions (Signed)
Pt was started on Xarelto for pulmonary embolism on 04/18/13.    Reviewed patients medication list.  Pt is not currently on any combined P-gp and strong CYP3A4 inhibitors/inducers (ketoconazole, traconazole, ritonavir, carbamazepine, phenytoin, rifampin, St. John's wort).  Reviewed labs from 04/14/13.  SCr 1.15, Weight 124kg, CrCl- 127.2.  Dose appropriate based on CrCl.   Hgb and HCT from 04/14/13  15.3/45.5.  A full discussion of the nature of anticoagulants has been carried out.  A benefit/risk analysis has been presented to the patient, so that they understand the justification for choosing anticoagulation with Xarelto at this time.  The need for compliance is stressed.  Pt is aware to take the medication once daily with the largest meal of the day.  Side effects of potential bleeding are discussed, including unusual colored urine or stools, coughing up blood or coffee ground emesis, nose bleeds or serious fall or head trauma.  Discussed signs and symptoms of stroke. The patient should avoid any OTC items containing aspirin or ibuprofen.  Avoid alcohol consumption.   Call if any signs of abnormal bleeding.  Discussed financial obligations and resolved any difficulty in obtaining medication.  Next lab test test in 1 month.

## 2013-05-07 ENCOUNTER — Telehealth: Payer: Self-pay | Admitting: *Deleted

## 2013-05-07 NOTE — Telephone Encounter (Signed)
PT IS SCHEDULED TO HAVE INJECTIONS IN BACK ON 05/19/13 AND NEEDS TO KNOW WHEN TO STOP James Cabrera

## 2013-05-08 NOTE — Telephone Encounter (Signed)
Pt told to take last dose of Xarelto on 10/25 for spinal injection on 10/27 and resume night of procedure if OK with MD.  Pt verbalized understanding.

## 2013-05-18 IMAGING — CR DG LUMBAR SPINE COMPLETE 4+V
5 series · 5 of 5 positions shown · non-contrast
Comparison: None.

CLINICAL DATA: Chronic low back pain.

LUMBAR SPINE - COMPLETE 4+ VIEW

[view not recorded (1 of 5)]
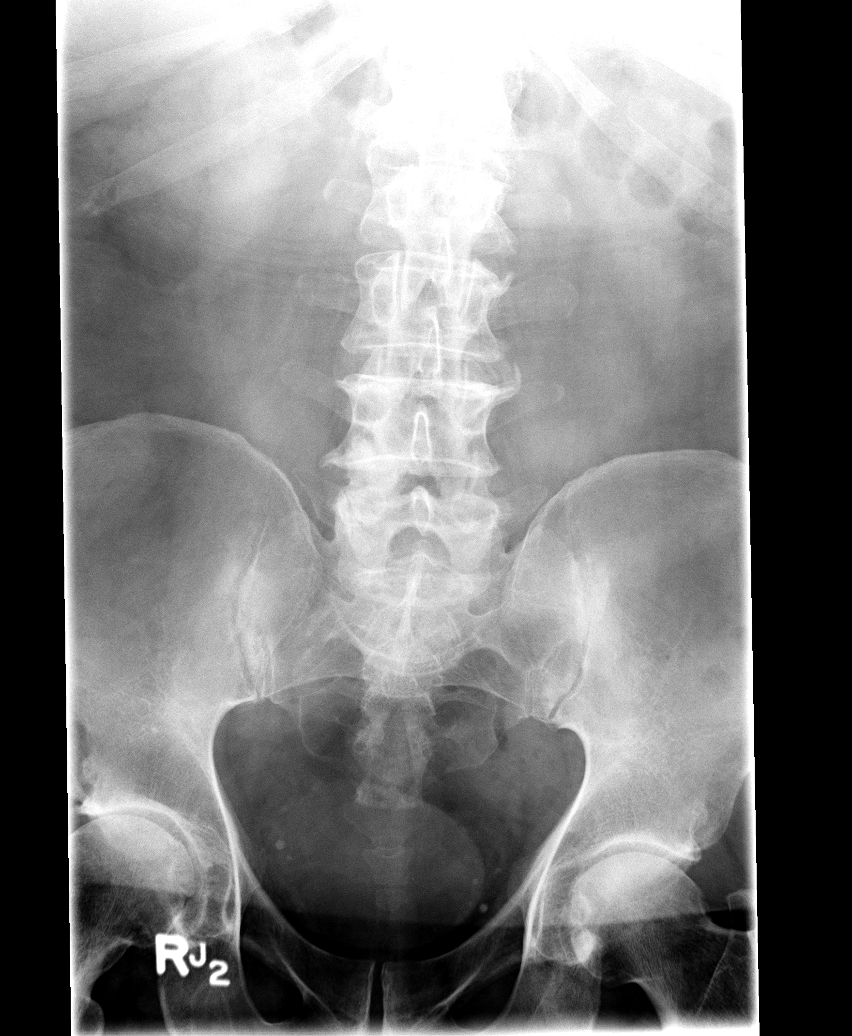

[view not recorded (2 of 5)]
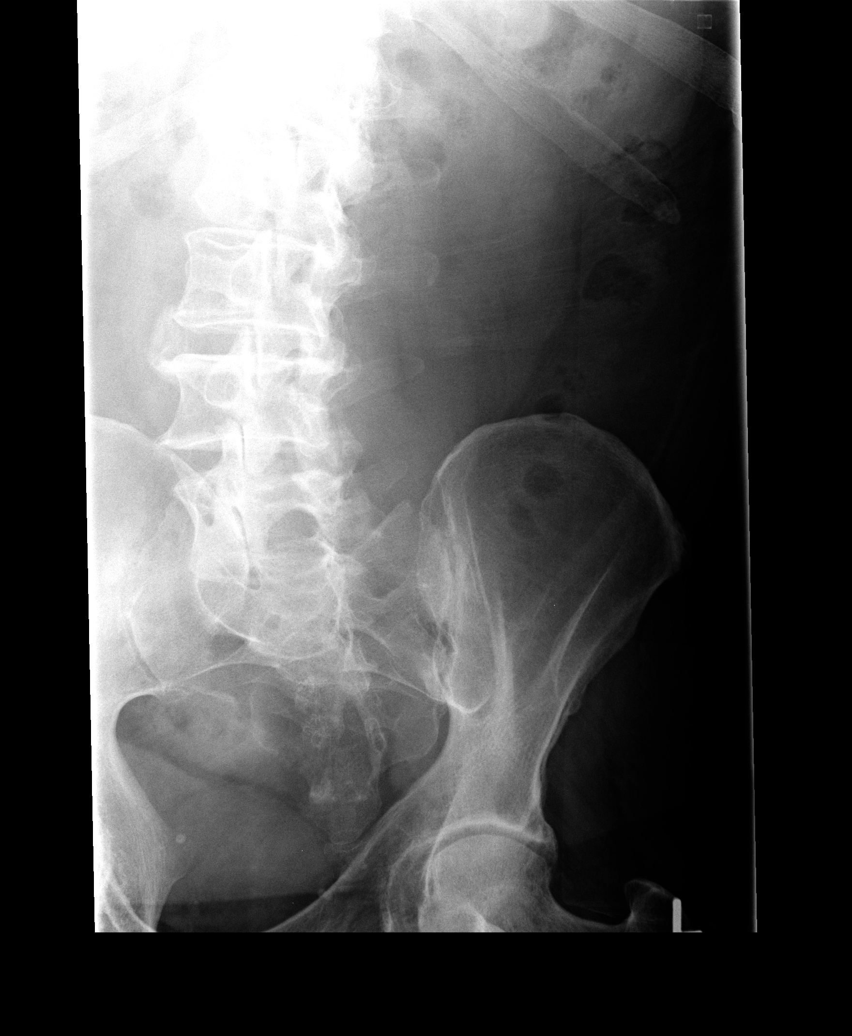

[view not recorded (3 of 5)]
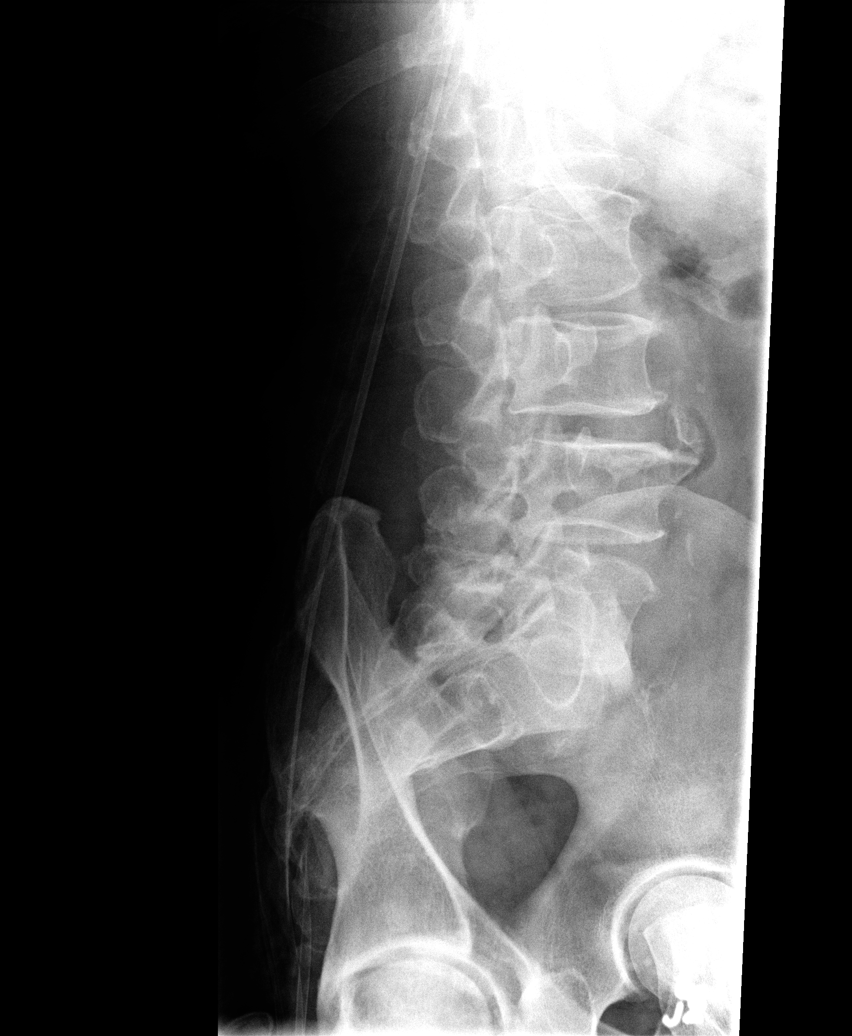

[view not recorded (4 of 5)]
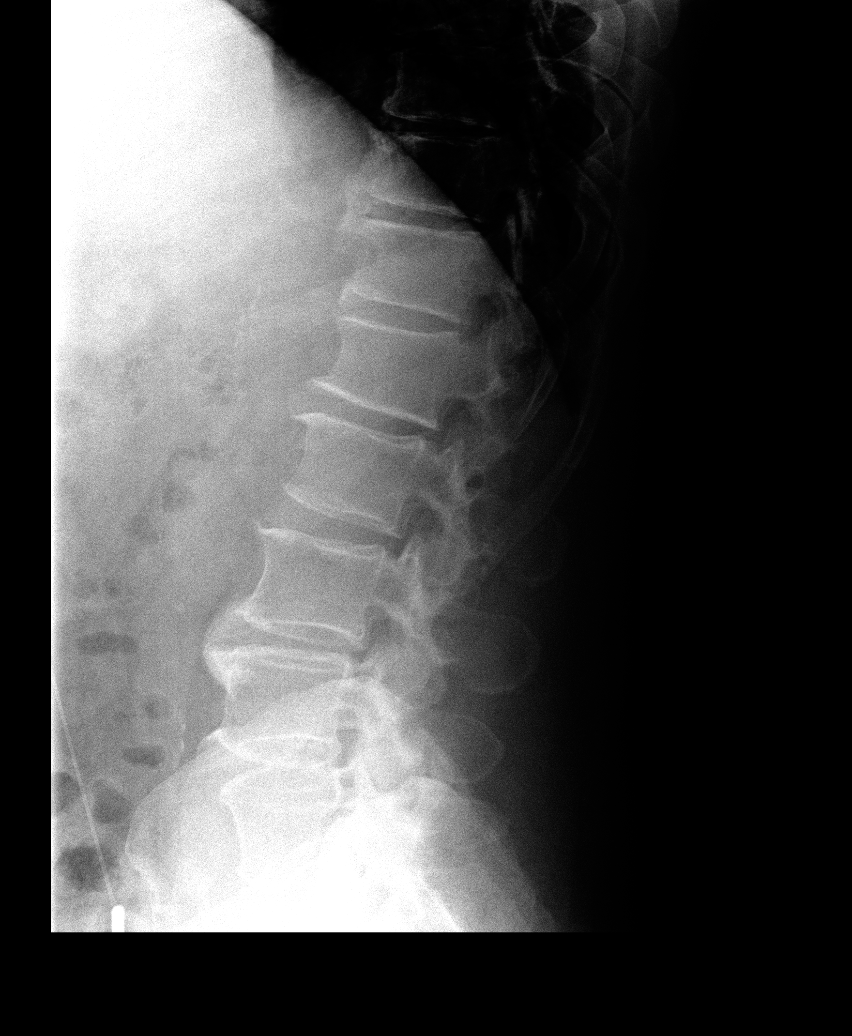

[view not recorded (5 of 5)]
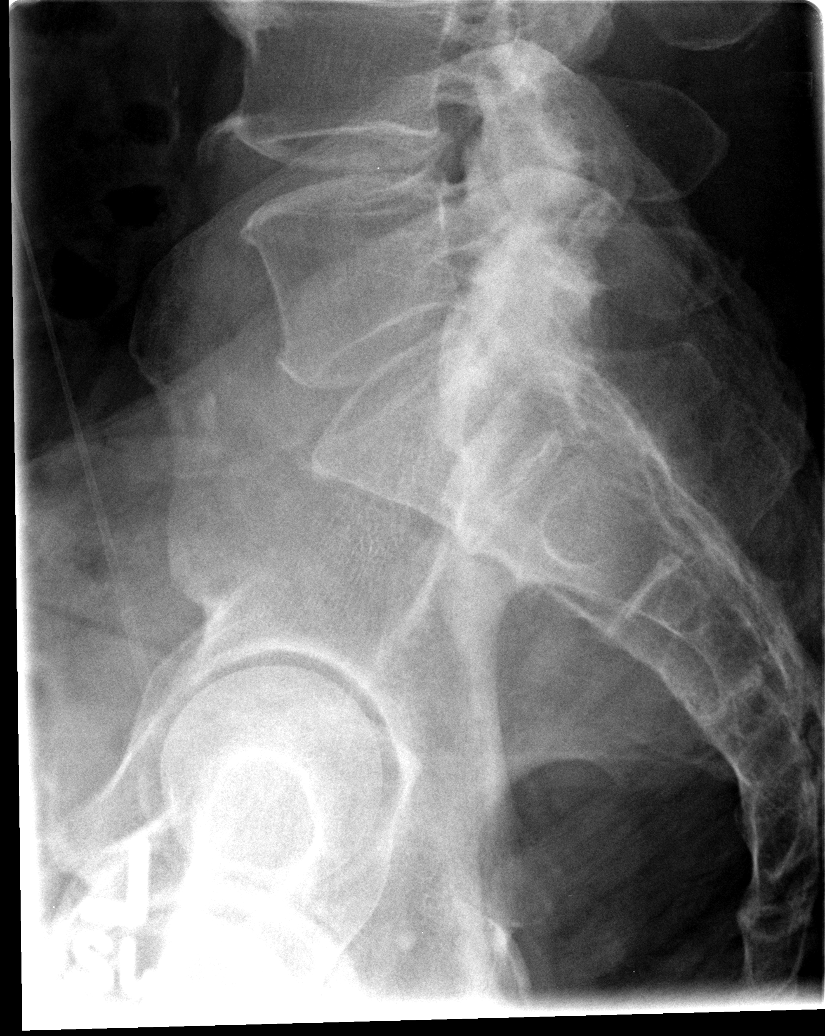

[5 of 5 positions shown; findings below may reference images not displayed]

FINDINGS: Degenerative changes in the lower thoracic spine and mid
lumbar spine with anterior spurring and early disc space narrowing.
Normal alignment.  No fracture SI joints are symmetric and
unremarkable.  No fracture.
IMPRESSION: Degenerative disc changes as above.  No acute findings.

## 2013-05-19 ENCOUNTER — Telehealth: Payer: Self-pay | Admitting: Internal Medicine

## 2013-05-19 ENCOUNTER — Other Ambulatory Visit: Payer: Self-pay | Admitting: Neurosurgery

## 2013-05-19 ENCOUNTER — Ambulatory Visit
Admission: RE | Admit: 2013-05-19 | Discharge: 2013-05-19 | Disposition: A | Discharge status: Home / Self Care | Payer: BC Managed Care – PPO | Source: Ambulatory Visit | Attending: Neurosurgery | Admitting: Neurosurgery

## 2013-05-19 VITALS — BP 182/84 | HR 71

## 2013-05-19 DIAGNOSIS — M549 Dorsalgia, unspecified: Secondary | ICD-10-CM

## 2013-05-19 MED ORDER — IOHEXOL 180 MG/ML  SOLN: 2.0000 mL | Freq: Once | INTRAMUSCULAR | Status: AC | PRN

## 2013-05-19 MED ORDER — RIVAROXABAN 20 MG PO TABS: 20.0000 mg | ORAL_TABLET | Freq: Every day | ORAL | Status: DC

## 2013-05-19 MED ORDER — METHYLPREDNISOLONE ACETATE 40 MG/ML INJ SUSP (RADIOLOG: 120.0000 mg | Freq: Once | INTRAMUSCULAR | Status: DC

## 2013-05-19 NOTE — Telephone Encounter (Signed)
Medication sent via escribe.  

## 2013-05-19 NOTE — Telephone Encounter (Signed)
Needs RX on Xarelto sent to [facility] on [address] / tgs

## 2013-05-22 ENCOUNTER — Ambulatory Visit (INDEPENDENT_AMBULATORY_CARE_PROVIDER_SITE_OTHER): Payer: BC Managed Care – PPO | Admitting: *Deleted

## 2013-05-22 ENCOUNTER — Encounter: Payer: Self-pay | Admitting: Adult Health

## 2013-05-22 ENCOUNTER — Ambulatory Visit (INDEPENDENT_AMBULATORY_CARE_PROVIDER_SITE_OTHER): Payer: BC Managed Care – PPO | Admitting: Adult Health

## 2013-05-22 VITALS — BP 130/78 | HR 80 | Ht 70.0 in | Wt 235.0 lb

## 2013-05-22 DIAGNOSIS — I2699 Other pulmonary embolism without acute cor pulmonale: Secondary | ICD-10-CM

## 2013-05-22 DIAGNOSIS — Z7901 Long term (current) use of anticoagulants: Secondary | ICD-10-CM

## 2013-05-22 DIAGNOSIS — I4891 Unspecified atrial fibrillation: Secondary | ICD-10-CM

## 2013-05-22 LAB — CBC
HCT: 44.1 % (ref 39.0–52.0)
Hemoglobin: 15.4 g/dL (ref 13.0–17.0)
MCH: 34.6 pg — ABNORMAL HIGH (ref 26.0–34.0)
MCHC: 34.9 g/dL (ref 30.0–36.0)
MCV: 99.1 fL (ref 78.0–100.0)
RDW: 13 % (ref 11.5–15.5)

## 2013-05-22 MED ORDER — LOSARTAN POTASSIUM-HCTZ 100-25 MG PO TABS: 1.0000 | ORAL_TABLET | Freq: Every day | ORAL | Status: DC

## 2013-05-22 MED ORDER — ATORVASTATIN CALCIUM 20 MG PO TABS: 20.0000 mg | ORAL_TABLET | Freq: Every day | ORAL | Status: DC

## 2013-05-22 NOTE — Patient Instructions (Addendum)
Pt was started on Xarelto 04/18/13 for Pulmonary Embolism.  Reviewed patients medication list.  Pt is not currently on any combined P-gp and strong CYP3A4 inhibitors/inducers (ketoconazole, traconazole, ritonavir, carbamazepine, phenytoin, rifampin, St. John's wort).  Reviewed labs 10/30 14.  SCr 1.28, Weight 106.5 , CrCl 98.23   Dose  Appropriate based on CrCl.   Hgb and HCT 15.4/44.1 on 05/22/13 and stable.  A full discussion of the nature of anticoagulants has been carried out.  A benefit/risk analysis has been presented to the patient, so that they understand the justification for choosing anticoagulation with Xarelto at this time.  The need for compliance is stressed.  Pt is aware to take the medication once daily with the largest meal of the day.  Side effects of potential bleeding are discussed, including unusual colored urine or stools, coughing up blood or coffee ground emesis, nose bleeds or serious fall or head trauma.  Discussed signs and symptoms of stroke. The patient should avoid any OTC items containing aspirin or ibuprofen.  Avoid alcohol consumption.   Call if any signs of abnormal bleeding.  Discussed financial obligations and resolved any difficulty in obtaining medication.  Next lab test test in 6 months.

## 2013-05-22 NOTE — Assessment & Plan Note (Signed)
He is doing well. Denies bleeding issues with Xarelto.  Will check a CBC and BMET now that he has been on this for one month for kidney fx and for anemia.

## 2013-05-22 NOTE — Patient Instructions (Addendum)
Your physician recommends that you schedule a follow-up appointment in: 1 year with Joni Reining, NP and 6 months with Vashti Hey.  Your physician recommends that you return for lab work this week. BMET, CBC

## 2013-05-22 NOTE — Progress Notes (Deleted)
Name: James Cabrera    DOB: 25-Mar-1956  Age: 57 y.o.  MR#: [id number]       PCP:  Avon Gully, MD      Insurance: Payor: BLUE CROSS BLUE SHIELD / Plan: BCBS [place] PPO / Product Type: *No Product type* /   CC:    Chief Complaint  Patient presents with  . Hypertension    VS Filed Vitals:   05/22/13 1359  BP: 130/78  Pulse: 80  Height: 5\' 10"  (1.778 m)  Weight: 235 lb (106.595 kg)    Weights Current Weight  05/22/13 235 lb (106.595 kg)  04/29/12 227 lb (102.967 kg)  02/27/11 212 lb (96.163 kg)    Blood Pressure  BP Readings from Last 3 Encounters:  05/22/13 130/78  05/19/13 182/84  03/17/13 148/78     Admit date:  (Not on file) Last encounter with RMR:  Visit date not found   Allergy Penicillins  Current Outpatient Prescriptions  Medication Sig Dispense Refill  . aspirin 81 MG tablet Take 81 mg by mouth daily.        Marland Kitchen atorvastatin (LIPITOR) 20 MG tablet Take 20 mg by mouth daily.      Marland Kitchen losartan-hydrochlorothiazide (HYZAAR) 100-25 MG per tablet Take 1 tablet by mouth daily.      . Rivaroxaban (XARELTO) 20 MG TABS tablet Take 1 tablet (20 mg total) by mouth daily.  30 tablet  6   No current facility-administered medications for this visit.    Discontinued Meds:    Medications Discontinued During This Encounter  Medication Reason  . HYDROmorphone (DILAUDID) 2 MG tablet Error  . oxyCODONE (ROXICODONE) 15 MG immediate release tablet Error    Patient Active Problem List   Diagnosis Date Noted  . 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 Long term (current) use of anticoagulants 04/17/2012    LABS    Component Value Date/Time   NA 139 04/14/2013 1122   K 5.0 04/14/2013 1122   CL 103 04/14/2013 1122   CO2 30 04/14/2013 1122   GLUCOSE 92 04/14/2013 1122   BUN 28* 04/14/2013 1122   CREATININE 1.15 04/14/2013 1122   CALCIUM 9.8 04/14/2013 1122   CMP     Component Value Date/Time   NA 139 04/14/2013 1122   K 5.0 04/14/2013 1122   CL 103 04/14/2013  1122   CO2 30 04/14/2013 1122   GLUCOSE 92 04/14/2013 1122   BUN 28* 04/14/2013 1122   CREATININE 1.15 04/14/2013 1122   CALCIUM 9.8 04/14/2013 1122       Component Value Date/Time   WBC 9.2 04/14/2013 1122   HGB 15.3 04/14/2013 1122   HCT 45.5 04/14/2013 1122   MCV 100.4* 04/14/2013 1122    Lipid Panel  No results found for this basename: chol, trig, hdl, cholhdl, vldl, ldlcalc    ABG No results found for this basename: phart, pco2, pco2art, po2, po2art, hco3, tco2, acidbasedef, o2sat     No results found for this basename: TSH   BNP (last 3 results) No results found for this basename: PROBNP,  in the last 8760 hours Cardiac Panel (last 3 results) No results found for this basename: CKTOTAL, CKMB, TROPONINI, RELINDX,  in the last 72 hours  Iron/TIBC/Ferritin No results found for this basename: iron, tibc, ferritin     EKG Orders placed in visit on 04/29/12  . EKG 12-LEAD     Prior Assessment and Plan Problem List as of 05/22/2013     Cardiovascular and Mediastinum  Other pulmonary embolism and infarction     Other   Long term (current) use of anticoagulants       Imaging: Dg Facet Jt Inj L /s 2nd Level Right W/fl/ct  05/22/2013   : CLINICAL DATA:  Bilateral low back pain with some mechanical characteristics. Catching sensation on the right. 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 The patient got minor improvement following the right-sided facet injections done in August. Symptoms recurred after several weeks. I considered performing epidural injection today, but decided instead to persist with facet injections given his initial response.  EXAM:  DG FACET INJECTION  COMPARISON:  Myelography 02/10/2013. Previous injection images.  PROCEDURE:  The procedure, risks, benefits, and alternatives were explained to the patient. Questions regarding the procedure were encouraged and  answered. The patient understands and consents to the procedure.  Posterior oblique approaches were taken to the facets on the right at L4-5 and L5-S1 and on the left at L4-5 using curved 5 inch 22 gauge spinal needles. Intra-articular positioning was confirmed by injecting a small amount of Omnipaque 180. No vascular opacification is seen. 40 mg of Depo-Medrol mixed with 0.5 cc 0.5% bupivacaine were instilled into each joint. All 3 injections seem to be associated with some familiar symptoms. The procedure was well-tolerated. Evaluation 20 min after the injection showed considerable improvement. He was able to stand and bend with much less pain. The expected time courses of the anesthetic and steroid affects were discussed with the patient and he is to followup with Dr. Gerlene Fee.  FLUOROSCOPY TIME:  3 min 38 seconds  IMPRESSION:  Technically successful right-sided L4-5 and L5-S1 and left-sided L4-5 facet injection .   Electronically Signed   By: Paulina Fusi M.D.   On: 05/22/2013 12:23   Dg Facet Jt Inj L /s Single Level Left W/fl/ct  05/22/2013   : CLINICAL DATA:  Bilateral low back pain with some mechanical characteristics. Catching sensation on the right. 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 The patient got minor improvement following the right-sided facet injections done in August. Symptoms recurred after several weeks. I considered performing epidural injection today, but decided instead to persist with facet injections given his initial response.  EXAM:  DG FACET INJECTION  COMPARISON:  Myelography 02/10/2013. Previous injection images.  PROCEDURE:  The procedure, risks, benefits, and alternatives were explained to the patient. Questions regarding the procedure were encouraged and answered. The patient understands and consents to the procedure.  Posterior oblique approaches were taken to the facets on  the right at L4-5 and L5-S1 and on the left at L4-5 using curved 5 inch 22 gauge spinal needles. Intra-articular positioning was confirmed by injecting a small amount of Omnipaque 180. No vascular opacification is seen. 40 mg of Depo-Medrol mixed with 0.5 cc 0.5% bupivacaine were instilled into each joint. All 3 injections seem to be associated with some familiar symptoms. The procedure was well-tolerated. Evaluation 20 min after the injection showed considerable improvement. He was able to stand and bend with much less pain. The expected time courses of the anesthetic and steroid affects were discussed with the patient and he is to followup with Dr. Gerlene Fee.  FLUOROSCOPY TIME:  3 min 38 seconds  IMPRESSION:  Technically successful right-sided L4-5 and L5-S1 and  left-sided L4-5 facet injection .   Electronically Signed   By: Paulina Fusi M.D.   On: 05/22/2013 12:24   Dg Facet Jt Inj L /s Single Level Right W/fl/ct  05/19/2013   CLINICAL DATA:  Bilateral low back pain with some mechanical characteristics. Catching sensation on the right. 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 The patient got minor improvement following the right-sided facet injections done in August. Symptoms recurred after several weeks. I considered performing epidural injection today, but decided instead to persist with facet injections given his initial response.  EXAM: DG FACET INJECTION  COMPARISON:  Myelography 02/10/2013. Previous injection images.  FLUOROSCOPY TIME:  3 min 38 seconds  PROCEDURE: The procedure, risks, benefits, and alternatives were explained to the patient. Questions regarding the procedure were encouraged and answered. The patient understands and consents to the procedure.  Posterior oblique approaches were taken to the facets on the right at L4-5 and L5-S1 and on the left at L4-5 using curved 5 inch 22 gauge spinal  needles. Intra-articular positioning was confirmed by injecting a small amount of Omnipaque 180. No vascular opacification is seen. 40 mg of Depo-Medrol mixed with 0.5 cc 0.5% bupivacaine were instilled into each joint. All 3 injections seem to be associated with some familiar symptoms. The procedure was well-tolerated. Evaluation 20 min after the injection showed considerable improvement. He was able to stand and bend with much less pain. The expected time courses of the anesthetic and steroid affects were discussed with the patient and he is to followup with Dr. Gerlene Fee.  IMPRESSION: Technically successful right-sided L4-5 and L5-S1 and left-sided L4-36facet injection .   Electronically Signed   By: Paulina Fusi M.D.   On: 05/19/2013 08:51

## 2013-05-22 NOTE — Progress Notes (Signed)
    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 The patient continues on anticoagulation with Xarelto, which was changed from coumadin one month ago. He was last seen in the office one year ago by Dr. Tenny Craw and recommended for lifelong anticoagulation. Blood pressure that time was well controlled at 120/78. He is here for annual visit.   He is tolerating the Xarelto very well. No excessive bruising, no bleeding or dizziness. He is receiving injections in his back for DDD.   Allergies  Allergen Reactions  . Penicillins Hives and Rash    ALMOST DIED/ COULDN'T REMEMBER EXACT PROBLEM    Current Outpatient Prescriptions  Medication Sig Dispense Refill  . aspirin 81 MG tablet Take 81 mg by mouth daily.        Marland Kitchen atorvastatin (LIPITOR) 20 MG tablet Take 1 tablet (20 mg total) by mouth daily.  30 tablet  11  . losartan-hydrochlorothiazide (HYZAAR) 100-25 MG per tablet Take 1 tablet by mouth daily.  30 tablet  11  . Rivaroxaban (XARELTO) 20 MG TABS tablet Take 1 tablet (20 mg total) by mouth daily.  30 tablet  6   No current facility-administered medications for this visit.    Past Medical History  Diagnosis Date  . Acid reflux     Past Surgical History  Procedure Laterality Date  . Knee arthroscopy      ROS: Review of systems complete and found to be negative unless listed above PHYSICAL EXAM BP 130/78  Pulse 80  Ht 5\' 10"  (1.778 m)  Wt 235 lb (106.595 kg)  BMI 33.72 kg/m2  General: Well developed, well nourished, obese, in no acute distress Head: Eyes PERRLA, No xanthomas.   Normal cephalic and atramatic  Lungs: Clear bilaterally to auscultation and percussion. Heart: HRRR S1 S2, without MRG.  Pulses are 2+ & equal.            No carotid bruit. No JVD.  No abdominal bruits. No femoral bruits. Abdomen: Bowel sounds are positive, abdomen soft and non-tender without masses or            Hernia's noted. Msk:  Back normal, normal gait. Normal strength and tone for age. Extremities: No clubbing, cyanosis or edema. Venous stasis skin changes with discoloration in the left lower extremity.  DP +1 Neuro: Alert and oriented X 3. Psych:  Good affect, responds appropriately    ASSESSMENT AND PLAN

## 2013-05-23 LAB — BASIC METABOLIC PANEL
CO2: 26 mEq/L (ref 19–32)
Chloride: 99 mEq/L (ref 96–112)
Glucose, Bld: 97 mg/dL (ref 70–99)
Sodium: 138 mEq/L (ref 135–145)

## 2013-05-26 ENCOUNTER — Encounter: Payer: Self-pay | Admitting: *Deleted

## 2013-10-23 ENCOUNTER — Other Ambulatory Visit: Payer: Self-pay | Admitting: Neurosurgery

## 2013-10-29 ENCOUNTER — Encounter (HOSPITAL_COMMUNITY): Payer: Self-pay | Admitting: Pharmacy Technician

## 2013-11-05 ENCOUNTER — Ambulatory Visit (HOSPITAL_COMMUNITY)
Admission: RE | Admit: 2013-11-05 | Discharge: 2013-11-05 | Disposition: A | Discharge status: Home / Self Care | Payer: BC Managed Care – PPO | Source: Ambulatory Visit | Attending: Neurosurgery | Admitting: Neurosurgery

## 2013-11-05 ENCOUNTER — Encounter (HOSPITAL_COMMUNITY)
Admission: RE | Admit: 2013-11-05 | Discharge: 2013-11-05 | Disposition: A | Discharge status: Home / Self Care | Payer: BC Managed Care – PPO | Source: Ambulatory Visit | Attending: Neurosurgery | Admitting: Neurosurgery

## 2013-11-05 ENCOUNTER — Encounter (HOSPITAL_COMMUNITY): Payer: Self-pay

## 2013-11-05 DIAGNOSIS — Z01810 Encounter for preprocedural cardiovascular examination: Secondary | ICD-10-CM | POA: Insufficient documentation

## 2013-11-05 DIAGNOSIS — Z01818 Encounter for other preprocedural examination: Secondary | ICD-10-CM | POA: Insufficient documentation

## 2013-11-05 DIAGNOSIS — Z01812 Encounter for preprocedural laboratory examination: Secondary | ICD-10-CM | POA: Insufficient documentation

## 2013-11-05 HISTORY — DX: Adverse effect of unspecified anesthetic, initial encounter: T41.45XA

## 2013-11-05 HISTORY — DX: Unspecified osteoarthritis, unspecified site: M19.90

## 2013-11-05 HISTORY — DX: Other complications of anesthesia, initial encounter: T88.59XA

## 2013-11-05 HISTORY — DX: Unspecified asthma, uncomplicated: J45.909

## 2013-11-05 HISTORY — DX: Other pulmonary embolism without acute cor pulmonale: I26.99

## 2013-11-05 HISTORY — DX: Pneumonia, unspecified organism: J18.9

## 2013-11-05 HISTORY — DX: Personal history of other medical treatment: Z92.89

## 2013-11-05 HISTORY — DX: Shortness of breath: R06.02

## 2013-11-05 HISTORY — DX: Essential (primary) hypertension: I10

## 2013-11-05 LAB — CBC
HEMATOCRIT: 44 % (ref 39.0–52.0)
HEMOGLOBIN: 15.1 g/dL (ref 13.0–17.0)
MCH: 33.9 pg (ref 26.0–34.0)
MCHC: 34.3 g/dL (ref 30.0–36.0)
MCV: 98.7 fL (ref 78.0–100.0)
PLATELETS: 248 10*3/uL (ref 150–400)
RBC: 4.46 MIL/uL (ref 4.22–5.81)
RDW: 12.5 % (ref 11.5–15.5)
WBC: 7.5 10*3/uL (ref 4.0–10.5)

## 2013-11-05 LAB — SURGICAL PCR SCREEN
MRSA, PCR: NEGATIVE
STAPHYLOCOCCUS AUREUS: NEGATIVE

## 2013-11-05 LAB — BASIC METABOLIC PANEL
BUN: 23 mg/dL (ref 6–23)
CO2: 27 meq/L (ref 19–32)
Calcium: 10 mg/dL (ref 8.4–10.5)
Chloride: 103 mEq/L (ref 96–112)
Creatinine, Ser: 1.26 mg/dL (ref 0.50–1.35)
GFR calc Af Amer: 72 mL/min — ABNORMAL LOW (ref >90)
GFR calc non Af Amer: 62 mL/min — ABNORMAL LOW (ref >90)
Glucose, Bld: 96 mg/dL (ref 70–99)
POTASSIUM: 4.6 meq/L (ref 3.7–5.3)
SODIUM: 142 meq/L (ref 137–147)

## 2013-11-05 NOTE — Pre-Procedure Instructions (Signed)
James Cabrera  11/05/2013   Your procedure is scheduled on: Tuesday, November 11, 2013 at 12:22 PM  Report to [facility] Short Stay (use Main Entrance "A'') at 10:15 AM.  Call this number if you have problems the morning of surgery: [phone]   Remember:   Do not eat food or drink liquids after midnight.   Take these medicines the morning of surgery with A SIP OF WATER: gabapentin (NEURONTIN),  If needed: oxyCODONE (ROXICODONE) for pain  Do not wear jewelry, make-up or nail polish.  Do not wear lotions, powders, or perfumes. You may wear deodorant.   Men may shave face and neck.  Do not bring valuables to the hospital.  [facility] is not responsible for any belongings or valuables.               Contacts, dentures or bridgework may not be worn into surgery.  Leave suitcase in the car. After surgery it may be brought to your room.  For patients admitted to the hospital, discharge time is determined by your  treatment team.               Patients discharged the day of surgery will not be allowed to drive home.  Name and phone number of your driver:  Special Instructions:  Special Instructions:Special Instructions: [facility] - Preparing for Surgery  Before surgery, you can play an important role.  Because skin is not sterile, your skin needs to be as free of germs as possible.  You can reduce the number of germs on you skin by washing with CHG (chlorahexidine gluconate) soap before surgery.  CHG is an antiseptic cleaner which kills germs and bonds with the skin to continue killing germs even after washing.  Please DO NOT use if you have an allergy to CHG or antibacterial soaps.  If your skin becomes reddened/irritated stop using the CHG and inform your nurse when you arrive at Short Stay.  Do not shave (including legs and underarms) for at least 48 hours prior to the first CHG shower.  You may shave your face.  Please follow these instructions carefully:   1.  Shower with CHG  Soap the night before surgery and the morning of Surgery.  2.  If you choose to wash your hair, wash your hair first as usual with your normal shampoo.  3.  After you shampoo, rinse your hair and body thoroughly to remove the Shampoo.  4.  Use CHG as you would any other liquid soap.  You can apply chg directly  to the skin and wash gently with scrungie or a clean washcloth.  5.  Apply the CHG Soap to your body ONLY FROM THE NECK DOWN.  Do not use on open wounds or open sores.  Avoid contact with your eyes, ears, mouth and genitals (private parts).  Wash genitals (private parts) with your normal soap.  6.  Wash thoroughly, paying special attention to the area where your surgery will be performed.  7.  Thoroughly rinse your body with warm water from the neck down.  8.  DO NOT shower/wash with your normal soap after using and rinsing off the CHG Soap.  9.  Pat yourself dry with a clean towel.            10.  Wear clean pajamas.            11.  Place clean sheets on your bed the night of your first shower and do  not sleep with pets.  Day of Surgery  Do not apply any lotions the morning of surgery.  Please wear clean clothes to the hospital/surgery center.   Please read over the following fact sheets that you were given: Pain Booklet, Coughing and Deep Breathing, MRSA Information and Surgical Site Infection Prevention

## 2013-11-05 NOTE — Progress Notes (Signed)
Request made to [facility], done 2014 post PE

## 2013-11-06 NOTE — Progress Notes (Addendum)
Anesthesia Chart Review:  Patient is a 58 year old male scheduled for left L4-5 laminectomy on 11/11/13 by Dr. Gerlene FeeKritzer.  History includes smoking, HTN, DVT X 2, PE 03/2012, asthma, arthritis, GERD, PCP is Dr. Avon Gullyesfaye Fanta.  He saw cardiologist Dr. Christella NoaPaula Miller in 07/31/[facility] as part of a preoperative evaluation for vascular surgery which he never had.  He is also followed by cardiologist Dr. Dietrich PatesPaula Ross for HTN and DVT/PE.  Life long anticoagulation has been recommended due to his recurrent DVT/PE history. Dr. Gerlene FeeKritzer has asked patient to hold his Xarelto and ASA starting 11/04/13 in anticipation for surgery.  Echocardiogram on 09/16/12 [facility] showed normal left ventricular systolic function, ejection fraction 60-65%, mildly impaired left ventricular relaxation (grade 1), normal right ventricular contractile performance. Trivial mitral and tricuspid regurgitation.  Nuclear stress test on 09/16/12 [facility] showed: Probably abnormal myocardial perfusion study, but with no evidence for infarction. There is a moderate in size, moderate in severity, partially reversible defect involving the inferior segment. This is consistent with motion and/or diaphragmatic attenuation artifact versus possible ischemia. Global systolic function is normal. The EF calculated at 49% rest and 56% by stress. For more definitive evaluation, consider repeating the study at a cardiac SPECT-CT or PET-CT study. (Neither of these studies were done. Patient confirms that he had no further follow-up testing following his stress test.  He states he has a follow-up note from Dr. Hyacinth MeekerMiller that he will bring in to PAT tomorrow.)   Stress test in 2014 was done for an episode of chest pain.  He denies any recurrent chest pain.  He feels his breathing is stable.  He has had no new edema.  His activity is fairly limited due to back and buttock pain, to the point that he can barely walk at times.  EKG on 11/05/13 showed NSR.  Preoperative CXR and labs  noted.    I'll follow-up once I get additional records from patient.  Velna Ochsllison Zelenak, PA-C [facility] Short Stay Center/Anesthesiology Phone [phone] 11/06/2013 4:57 PM  Addendum 11/07/2013 12:50 PM: Patient brought it his records from Dr. Christella NoaPaula Miller. She hand wrote on his 09/16/12 stress test results "No evidence for any significant ischemia."  I reviewed with anesthesiologist Dr. Katrinka BlazingSmith who agrees that since Dr. Hyacinth MeekerMiller felt there was no evidence of any significant ischemia and no further testing was ordered that he should be okay to proceed if no acute changes or new CV symptomology.  If regards to his DVT/PE history, we did recommend that Dr. Trudee GripKritzer's office touch base with cardiology at [facility] [place] to see if patient should be started on a Lovenox bridge while off of his Xarelto and ASA since he has a history of recurrent DVT and a PE in 03/2012. Erie NoeVanessa at Dr. Trudee GripKritzer's office notified.

## 2013-11-07 ENCOUNTER — Telehealth: Payer: Self-pay | Admitting: *Deleted

## 2013-11-07 NOTE — Telephone Encounter (Signed)
Pt is scheduled for surgery on Tuesday for a lumbar lumanextomy and is on xerelto and they need to know if this needs bridges with lovanox

## 2013-11-07 NOTE — Telephone Encounter (Signed)
Left message for Erie NoeVanessa on her extension.

## 2013-11-07 NOTE — Telephone Encounter (Signed)
Stop Xatelto 24 to 48 hours before procedure. Restart the same day or day after surgery when ok with the surgeon. No need for LMWH bridge.

## 2013-11-10 ENCOUNTER — Telehealth: Payer: Self-pay | Admitting: *Deleted

## 2013-11-10 MED ORDER — VANCOMYCIN HCL 10 G IV SOLR
1500.0000 mg | INTRAVENOUS | Status: AC
  Administered 2013-11-11: 1500 mg via INTRAVENOUS
  Filled 2013-11-10: qty 1500

## 2013-11-10 MED ORDER — DEXAMETHASONE SODIUM PHOSPHATE 10 MG/ML IJ SOLN
10.0000 mg | INTRAMUSCULAR | Status: AC
  Administered 2013-11-11: 10 mg via INTRAVENOUS
  Filled 2013-11-10: qty 1

## 2013-11-10 NOTE — Telephone Encounter (Signed)
Pt scheduled for lumber laminectomy tomorrow.  Said Dr Gerlene FeeKritzer stopped his Xarelto 4/14.  Told pt recomendation is to hold 24-48 hrs prior to surgery but too late to do anything different now.  Pt to restart Xarelto tomorrow night after surgery if OK with MD.

## 2013-11-10 NOTE — Telephone Encounter (Signed)
Pt is calling with questions about xarelto. He is having surgery 11/11/13 and was took off xarelto on 11/04/13

## 2013-11-11 ENCOUNTER — Ambulatory Visit (HOSPITAL_COMMUNITY): Payer: BC Managed Care – PPO | Admitting: Certified Registered"

## 2013-11-11 ENCOUNTER — Encounter (HOSPITAL_COMMUNITY)
Admission: RE | Disposition: A | Discharge status: Home / Self Care | Payer: Self-pay | Source: Ambulatory Visit | Attending: Neurosurgery

## 2013-11-11 ENCOUNTER — Ambulatory Visit (HOSPITAL_COMMUNITY)
Admission: RE | Admit: 2013-11-11 | Discharge: 2013-11-12 | Disposition: A | Discharge status: Home / Self Care | Payer: BC Managed Care – PPO | Source: Ambulatory Visit | Attending: Neurosurgery | Admitting: Neurosurgery

## 2013-11-11 ENCOUNTER — Encounter (HOSPITAL_COMMUNITY): Payer: Self-pay | Admitting: Certified Registered"

## 2013-11-11 ENCOUNTER — Encounter (HOSPITAL_COMMUNITY): Payer: BC Managed Care – PPO | Admitting: Vascular Surgery

## 2013-11-11 ENCOUNTER — Ambulatory Visit (HOSPITAL_COMMUNITY): Payer: BC Managed Care – PPO

## 2013-11-11 DIAGNOSIS — M47816 Spondylosis without myelopathy or radiculopathy, lumbar region: Secondary | ICD-10-CM | POA: Diagnosis present

## 2013-11-11 DIAGNOSIS — I10 Essential (primary) hypertension: Secondary | ICD-10-CM | POA: Insufficient documentation

## 2013-11-11 DIAGNOSIS — Z7982 Long term (current) use of aspirin: Secondary | ICD-10-CM | POA: Insufficient documentation

## 2013-11-11 DIAGNOSIS — M47817 Spondylosis without myelopathy or radiculopathy, lumbosacral region: Secondary | ICD-10-CM | POA: Insufficient documentation

## 2013-11-11 DIAGNOSIS — Z7901 Long term (current) use of anticoagulants: Secondary | ICD-10-CM | POA: Insufficient documentation

## 2013-11-11 DIAGNOSIS — F17200 Nicotine dependence, unspecified, uncomplicated: Secondary | ICD-10-CM | POA: Insufficient documentation

## 2013-11-11 DIAGNOSIS — Z79899 Other long term (current) drug therapy: Secondary | ICD-10-CM | POA: Insufficient documentation

## 2013-11-11 DIAGNOSIS — Z86711 Personal history of pulmonary embolism: Secondary | ICD-10-CM | POA: Insufficient documentation

## 2013-11-11 DIAGNOSIS — J45909 Unspecified asthma, uncomplicated: Secondary | ICD-10-CM | POA: Insufficient documentation

## 2013-11-11 DIAGNOSIS — K219 Gastro-esophageal reflux disease without esophagitis: Secondary | ICD-10-CM | POA: Insufficient documentation

## 2013-11-11 HISTORY — PX: LUMBAR LAMINECTOMY/DECOMPRESSION MICRODISCECTOMY: SHX5026

## 2013-11-11 SURGERY — LUMBAR LAMINECTOMY/DECOMPRESSION MICRODISCECTOMY 1 LEVEL
Anesthesia: General | Site: Spine Lumbar | Laterality: Left

## 2013-11-11 MED ORDER — SUFENTANIL CITRATE 50 MCG/ML IV SOLN
INTRAVENOUS | Status: DC | PRN
Start: 2013-11-11 — End: 2013-11-11
  Administered 2013-11-11: 20 ug via INTRAVENOUS

## 2013-11-11 MED ORDER — 0.9 % SODIUM CHLORIDE (POUR BTL) OPTIME
TOPICAL | Status: DC | PRN
  Administered 2013-11-11: 1000 mL

## 2013-11-11 MED ORDER — OXYCODONE HCL 5 MG PO TABS
ORAL_TABLET | ORAL | Status: AC
  Filled 2013-11-11: qty 1

## 2013-11-11 MED ORDER — PANTOPRAZOLE SODIUM 40 MG PO TBEC
40.0000 mg | DELAYED_RELEASE_TABLET | Freq: Every day | ORAL | Status: DC
  Administered 2013-11-11: 40 mg via ORAL
  Filled 2013-11-11: qty 1

## 2013-11-11 MED ORDER — BUPIVACAINE HCL (PF) 0.5 % IJ SOLN
INTRAMUSCULAR | Status: DC | PRN
  Administered 2013-11-11: 20 mL

## 2013-11-11 MED ORDER — KCL IN DEXTROSE-NACL 20-5-0.45 MEQ/L-%-% IV SOLN
80.0000 mL/h | INTRAVENOUS | Status: DC
  Filled 2013-11-11 (×3): qty 1000

## 2013-11-11 MED ORDER — ONDANSETRON HCL 4 MG/2ML IJ SOLN
INTRAMUSCULAR | Status: AC
  Filled 2013-11-11: qty 2

## 2013-11-11 MED ORDER — CYCLOBENZAPRINE HCL 10 MG PO TABS
10.0000 mg | ORAL_TABLET | Freq: Three times a day (TID) | ORAL | Status: DC | PRN
  Administered 2013-11-11: 10 mg via ORAL
  Filled 2013-11-11: qty 1

## 2013-11-11 MED ORDER — ALBUTEROL SULFATE (2.5 MG/3ML) 0.083% IN NEBU: 2.5000 mg | INHALATION_SOLUTION | Freq: Four times a day (QID) | RESPIRATORY_TRACT | Status: DC | PRN

## 2013-11-11 MED ORDER — MENTHOL 3 MG MT LOZG: 1.0000 | LOZENGE | OROMUCOSAL | Status: DC | PRN

## 2013-11-11 MED ORDER — ONDANSETRON HCL 4 MG/2ML IJ SOLN: 4.0000 mg | INTRAMUSCULAR | Status: DC | PRN

## 2013-11-11 MED ORDER — SODIUM CHLORIDE 0.9 % IJ SOLN: 3.0000 mL | INTRAMUSCULAR | Status: DC | PRN

## 2013-11-11 MED ORDER — KETOROLAC TROMETHAMINE 30 MG/ML IJ SOLN
30.0000 mg | Freq: Four times a day (QID) | INTRAMUSCULAR | Status: DC
  Administered 2013-11-11 – 2013-11-12 (×3): 30 mg via INTRAVENOUS
  Filled 2013-11-11 (×7): qty 1

## 2013-11-11 MED ORDER — GABAPENTIN 300 MG PO CAPS
300.0000 mg | ORAL_CAPSULE | Freq: Three times a day (TID) | ORAL | Status: DC
  Administered 2013-11-11 (×2): 300 mg via ORAL
  Filled 2013-11-11 (×5): qty 1

## 2013-11-11 MED ORDER — VANCOMYCIN HCL IN DEXTROSE 1-5 GM/200ML-% IV SOLN
1000.0000 mg | Freq: Once | INTRAVENOUS | Status: AC
  Administered 2013-11-11: 1000 mg via INTRAVENOUS
  Filled 2013-11-11 (×2): qty 200

## 2013-11-11 MED ORDER — EPHEDRINE SULFATE 50 MG/ML IJ SOLN
INTRAMUSCULAR | Status: DC | PRN
  Administered 2013-11-11: 10 mg via INTRAVENOUS
  Administered 2013-11-11: 15 mg via INTRAVENOUS

## 2013-11-11 MED ORDER — ROCURONIUM BROMIDE 50 MG/5ML IV SOLN
INTRAVENOUS | Status: AC
  Filled 2013-11-11: qty 1

## 2013-11-11 MED ORDER — SUFENTANIL CITRATE 50 MCG/ML IV SOLN
INTRAVENOUS | Status: AC
  Filled 2013-11-11: qty 1

## 2013-11-11 MED ORDER — LIDOCAINE HCL (CARDIAC) 20 MG/ML IV SOLN
INTRAVENOUS | Status: AC
  Filled 2013-11-11: qty 5

## 2013-11-11 MED ORDER — GLYCOPYRROLATE 0.2 MG/ML IJ SOLN
INTRAMUSCULAR | Status: AC
  Filled 2013-11-11: qty 2

## 2013-11-11 MED ORDER — NEOSTIGMINE METHYLSULFATE 1 MG/ML IJ SOLN
INTRAMUSCULAR | Status: DC | PRN
  Administered 2013-11-11: 3 mg via INTRAVENOUS

## 2013-11-11 MED ORDER — PHENOL 1.4 % MT LIQD: 1.0000 | OROMUCOSAL | Status: DC | PRN

## 2013-11-11 MED ORDER — PROPOFOL 10 MG/ML IV BOLUS
INTRAVENOUS | Status: AC
  Filled 2013-11-11: qty 20

## 2013-11-11 MED ORDER — NEOSTIGMINE METHYLSULFATE 1 MG/ML IJ SOLN
INTRAMUSCULAR | Status: AC
  Filled 2013-11-11: qty 10

## 2013-11-11 MED ORDER — DOCUSATE SODIUM 100 MG PO CAPS
100.0000 mg | ORAL_CAPSULE | Freq: Two times a day (BID) | ORAL | Status: DC
  Administered 2013-11-11: 100 mg via ORAL
  Filled 2013-11-11 (×3): qty 1

## 2013-11-11 MED ORDER — SODIUM CHLORIDE 0.9 % IJ SOLN
3.0000 mL | Freq: Two times a day (BID) | INTRAMUSCULAR | Status: DC
  Administered 2013-11-11: 3 mL via INTRAVENOUS

## 2013-11-11 MED ORDER — ROCURONIUM BROMIDE 100 MG/10ML IV SOLN
INTRAVENOUS | Status: DC | PRN
  Administered 2013-11-11 (×2): 10 mg via INTRAVENOUS
  Administered 2013-11-11: 50 mg via INTRAVENOUS

## 2013-11-11 MED ORDER — MIDAZOLAM HCL 5 MG/5ML IJ SOLN
INTRAMUSCULAR | Status: DC | PRN
  Administered 2013-11-11: 2 mg via INTRAVENOUS

## 2013-11-11 MED ORDER — PHENYLEPHRINE HCL 10 MG/ML IJ SOLN
INTRAMUSCULAR | Status: AC
  Filled 2013-11-11: qty 1

## 2013-11-11 MED ORDER — PHENYLEPHRINE HCL 10 MG/ML IJ SOLN
10.0000 mg | INTRAVENOUS | Status: DC | PRN
  Administered 2013-11-11: 40 ug/min via INTRAVENOUS

## 2013-11-11 MED ORDER — CYCLOBENZAPRINE HCL 10 MG PO TABS
ORAL_TABLET | ORAL | Status: AC
  Filled 2013-11-11: qty 1

## 2013-11-11 MED ORDER — ONDANSETRON HCL 4 MG/2ML IJ SOLN: 4.0000 mg | Freq: Once | INTRAMUSCULAR | Status: DC | PRN

## 2013-11-11 MED ORDER — MIDAZOLAM HCL 2 MG/2ML IJ SOLN
INTRAMUSCULAR | Status: AC
  Filled 2013-11-11: qty 2

## 2013-11-11 MED ORDER — ALBUTEROL SULFATE HFA 108 (90 BASE) MCG/ACT IN AERS: 1.0000 | INHALATION_SPRAY | Freq: Four times a day (QID) | RESPIRATORY_TRACT | Status: DC | PRN

## 2013-11-11 MED ORDER — GLYCOPYRROLATE 0.2 MG/ML IJ SOLN
INTRAMUSCULAR | Status: DC | PRN
  Administered 2013-11-11: 0.4 mg via INTRAVENOUS
  Administered 2013-11-11: 0.2 mg via INTRAVENOUS

## 2013-11-11 MED ORDER — THROMBIN 20000 UNITS EX SOLR
CUTANEOUS | Status: DC | PRN
  Administered 2013-11-11: 20000 [IU] via TOPICAL

## 2013-11-11 MED ORDER — HYDROMORPHONE HCL PF 1 MG/ML IJ SOLN
INTRAMUSCULAR | Status: AC
  Filled 2013-11-11: qty 1

## 2013-11-11 MED ORDER — HYDROMORPHONE HCL PF 1 MG/ML IJ SOLN: 1.0000 mg | INTRAMUSCULAR | Status: DC | PRN

## 2013-11-11 MED ORDER — HEMOSTATIC AGENTS (NO CHARGE) OPTIME
TOPICAL | Status: DC | PRN
  Administered 2013-11-11 (×2): 1 via TOPICAL

## 2013-11-11 MED ORDER — OXYCODONE HCL 5 MG PO TABS
5.0000 mg | ORAL_TABLET | Freq: Once | ORAL | Status: AC | PRN
  Administered 2013-11-11: 5 mg via ORAL

## 2013-11-11 MED ORDER — HYDROCHLOROTHIAZIDE 25 MG PO TABS
25.0000 mg | ORAL_TABLET | Freq: Every day | ORAL | Status: DC
  Administered 2013-11-12: 25 mg via ORAL
  Filled 2013-11-11 (×2): qty 1

## 2013-11-11 MED ORDER — LOSARTAN POTASSIUM 50 MG PO TABS
100.0000 mg | ORAL_TABLET | Freq: Every day | ORAL | Status: DC
  Administered 2013-11-12: 100 mg via ORAL
  Filled 2013-11-11 (×2): qty 2

## 2013-11-11 MED ORDER — OXYCODONE-ACETAMINOPHEN 5-325 MG PO TABS
1.0000 | ORAL_TABLET | ORAL | Status: DC | PRN
  Administered 2013-11-11: 2 via ORAL
  Filled 2013-11-11: qty 2

## 2013-11-11 MED ORDER — PANTOPRAZOLE SODIUM 40 MG IV SOLR
40.0000 mg | Freq: Every day | INTRAVENOUS | Status: DC
  Filled 2013-11-11: qty 40

## 2013-11-11 MED ORDER — HYDROMORPHONE HCL PF 1 MG/ML IJ SOLN
0.2500 mg | INTRAMUSCULAR | Status: DC | PRN
  Administered 2013-11-11 (×4): 0.5 mg via INTRAVENOUS

## 2013-11-11 MED ORDER — ACETAMINOPHEN 325 MG PO TABS: 650.0000 mg | ORAL_TABLET | ORAL | Status: DC | PRN

## 2013-11-11 MED ORDER — SODIUM CHLORIDE 0.9 % IR SOLN
Status: DC | PRN
  Administered 2013-11-11: 13:00:00

## 2013-11-11 MED ORDER — ATORVASTATIN CALCIUM 20 MG PO TABS
20.0000 mg | ORAL_TABLET | Freq: Every day | ORAL | Status: DC
  Administered 2013-11-12: 20 mg via ORAL
  Filled 2013-11-11 (×2): qty 1

## 2013-11-11 MED ORDER — ACETAMINOPHEN 650 MG RE SUPP: 650.0000 mg | RECTAL | Status: DC | PRN

## 2013-11-11 MED ORDER — OXYCODONE HCL 5 MG/5ML PO SOLN: 5.0000 mg | Freq: Once | ORAL | Status: AC | PRN

## 2013-11-11 MED ORDER — LOSARTAN POTASSIUM-HCTZ 100-25 MG PO TABS: 1.0000 | ORAL_TABLET | Freq: Every day | ORAL | Status: DC

## 2013-11-11 MED ORDER — PROPOFOL 10 MG/ML IV BOLUS
INTRAVENOUS | Status: DC | PRN
  Administered 2013-11-11: 200 mg via INTRAVENOUS

## 2013-11-11 MED ORDER — LIDOCAINE HCL (CARDIAC) 20 MG/ML IV SOLN
INTRAVENOUS | Status: DC | PRN
  Administered 2013-11-11: 60 mg via INTRAVENOUS

## 2013-11-11 MED ORDER — ONDANSETRON HCL 4 MG/2ML IJ SOLN
INTRAMUSCULAR | Status: DC | PRN
  Administered 2013-11-11: 4 mg via INTRAVENOUS

## 2013-11-11 MED ORDER — PHENYLEPHRINE HCL 10 MG/ML IJ SOLN
INTRAMUSCULAR | Status: DC | PRN
Start: 2013-11-11 — End: 2013-11-11
  Administered 2013-11-11 (×5): 80 ug via INTRAVENOUS

## 2013-11-11 MED ORDER — LACTATED RINGERS IV SOLN
INTRAVENOUS | Status: DC
  Administered 2013-11-11: 10 mL/h via INTRAVENOUS
  Administered 2013-11-11: 13:00:00 via INTRAVENOUS

## 2013-11-11 SURGICAL SUPPLY — 54 items
ADH SKN CLS APL DERMABOND .7 (GAUZE/BANDAGES/DRESSINGS) ×1
APL SKNCLS STERI-STRIP NONHPOA (GAUZE/BANDAGES/DRESSINGS) ×1
BAG DECANTER FOR FLEXI CONT (MISCELLANEOUS) ×3 IMPLANT
BENZOIN TINCTURE PRP APPL 2/3 (GAUZE/BANDAGES/DRESSINGS) ×4 IMPLANT
BLADE 10 SAFETY STRL DISP (BLADE) ×3 IMPLANT
BLADE SURG ROTATE 9660 (MISCELLANEOUS) ×1 IMPLANT
BRUSH SCRUB EZ PLAIN DRY (MISCELLANEOUS) ×3 IMPLANT
BUR CUTTER 7.0 ROUND (BURR) ×3 IMPLANT
BUR MATCHSTICK NEURO 3.0 LAGG (BURR) ×3 IMPLANT
CANISTER SUCT 3000ML (MISCELLANEOUS) ×3 IMPLANT
CLOSURE WOUND 1/2 X4 (GAUZE/BANDAGES/DRESSINGS) ×1
CONT SPEC 4OZ CLIKSEAL STRL BL (MISCELLANEOUS) ×3 IMPLANT
DERMABOND ADVANCED (GAUZE/BANDAGES/DRESSINGS) ×2
DERMABOND ADVANCED .7 DNX12 (GAUZE/BANDAGES/DRESSINGS) ×1 IMPLANT
DRAPE LAPAROTOMY 100X72X124 (DRAPES) ×3 IMPLANT
DRAPE MICROSCOPE ZEISS OPMI (DRAPES) ×3 IMPLANT
DRAPE SURG 17X23 STRL (DRAPES) ×6 IMPLANT
DRESSING TELFA 8X3 (GAUZE/BANDAGES/DRESSINGS) ×3 IMPLANT
DRSG OPSITE POSTOP 4X6 (GAUZE/BANDAGES/DRESSINGS) ×2 IMPLANT
DURAPREP 26ML APPLICATOR (WOUND CARE) ×3 IMPLANT
ELECT REM PT RETURN 9FT ADLT (ELECTROSURGICAL) ×3
ELECTRODE REM PT RTRN 9FT ADLT (ELECTROSURGICAL) ×1 IMPLANT
GAUZE SPONGE 4X4 16PLY XRAY LF (GAUZE/BANDAGES/DRESSINGS) IMPLANT
GLOVE BIOGEL PI IND STRL 7.5 (GLOVE) IMPLANT
GLOVE BIOGEL PI INDICATOR 7.5 (GLOVE) ×2
GLOVE ECLIPSE 7.0 STRL STRAW (GLOVE) ×4 IMPLANT
GLOVE ECLIPSE 8.0 STRL XLNG CF (GLOVE) ×3 IMPLANT
GLOVE ECLIPSE 8.5 STRL (GLOVE) ×2 IMPLANT
GOWN BRE IMP SLV AUR LG STRL (GOWN DISPOSABLE) IMPLANT
GOWN BRE IMP SLV AUR XL STRL (GOWN DISPOSABLE) ×1 IMPLANT
GOWN STRL REIN 2XL LVL4 (GOWN DISPOSABLE) IMPLANT
GOWN STRL REUS W/ TWL LRG LVL3 (GOWN DISPOSABLE) IMPLANT
GOWN STRL REUS W/ TWL XL LVL3 (GOWN DISPOSABLE) IMPLANT
GOWN STRL REUS W/TWL LRG LVL3 (GOWN DISPOSABLE) ×3
GOWN STRL REUS W/TWL XL LVL3 (GOWN DISPOSABLE) ×6
KIT BASIN OR (CUSTOM PROCEDURE TRAY) ×3 IMPLANT
KIT ROOM TURNOVER OR (KITS) ×3 IMPLANT
NDL SPNL 22GX3.5 QUINCKE BK (NEEDLE) ×2 IMPLANT
NEEDLE HYPO 22GX1.5 SAFETY (NEEDLE) ×3 IMPLANT
NEEDLE SPNL 22GX3.5 QUINCKE BK (NEEDLE) ×3 IMPLANT
NS IRRIG 1000ML POUR BTL (IV SOLUTION) ×3 IMPLANT
PACK LAMINECTOMY NEURO (CUSTOM PROCEDURE TRAY) ×3 IMPLANT
PAD ARMBOARD 7.5X6 YLW CONV (MISCELLANEOUS) ×13 IMPLANT
PATTIES SURGICAL .75X.75 (GAUZE/BANDAGES/DRESSINGS) ×1 IMPLANT
RUBBERBAND STERILE (MISCELLANEOUS) ×6 IMPLANT
SPONGE GAUZE 4X4 12PLY (GAUZE/BANDAGES/DRESSINGS) ×1 IMPLANT
SPONGE SURGIFOAM ABS GEL SZ50 (HEMOSTASIS) ×5 IMPLANT
STRIP CLOSURE SKIN 1/2X4 (GAUZE/BANDAGES/DRESSINGS) ×2 IMPLANT
SUT VIC AB 2-0 OS6 18 (SUTURE) ×9 IMPLANT
SUT VIC AB 3-0 CP2 18 (SUTURE) ×3 IMPLANT
SYR 20ML ECCENTRIC (SYRINGE) ×3 IMPLANT
TOWEL OR 17X24 6PK STRL BLUE (TOWEL DISPOSABLE) ×3 IMPLANT
TOWEL OR 17X26 10 PK STRL BLUE (TOWEL DISPOSABLE) ×3 IMPLANT
WATER STERILE IRR 1000ML POUR (IV SOLUTION) ×3 IMPLANT

## 2013-11-11 NOTE — H&P (Signed)
James Cabrera is an 58 y.o. male.   Chief Complaint: Back pain into the left leg HPI: The patient is a [age] old gentleman who was evaluated in the office many years ago for back pain into the legs. That time he improved with conservative therapy and did not continue his followup. He returned back in the summer of 2014 suckable and had the same issue. He had x-rays but no imaging studies. 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 At this time after discussing the options the patient requested surgery now comes for a left L4-5 decompressive laminotomy. I've had a long discussion with him regarding the risks and benefits of surgical intervention. The risks discussed include but are not limited to bleeding infection weakness numbness paralysis spinal fluid leak coma and death. We have discussed alternative methods of therapy although risks and benefits of nonintervention. He's had the opportunity to ask numerous questions and appears to understand. With this information in hand he has requested that we proceed with surgery.  Past Medical History  Diagnosis Date  . Acid reflux   . Complication of anesthesia     trouble breathing when I woke up & they had to put that thing on my face but then I was OK  . Hypertension   . Pulmonary embolism 2014    [facility] cared for pt., pt. reports that he was first on Heparin, then Coumadin & now Xarelto  . Peripheral vascular disease     after "knee surgery" many yrs. ago, he reports that he had a blood clot in his legs   . Pneumonia     hosp. x2 for pneumonia   . Asthma   . Shortness of breath   . Arthritis     legs, back   . History of stress test     done [facility], post PE, told it was wnl    Past Surgical History  Procedure Laterality Date  . Knee arthroscopy Right     x2    History reviewed. No pertinent family history. Social History:  reports that  he has been smoking Cigarettes.  He has a 11.1 pack-year smoking history. He has never used smokeless tobacco. He reports that he drinks alcohol. He reports that he does not use illicit drugs.  Allergies:  Allergies  Allergen Reactions  . Penicillins Hives and Rash    ALMOST DIED/ COULDN'T REMEMBER EXACT PROBLEM    Medications Prior to Admission  Medication Sig Dispense Refill  . albuterol (PROVENTIL HFA;VENTOLIN HFA) 108 (90 BASE) MCG/ACT inhaler Inhale 1 puff into the lungs every 6 (six) hours as needed for wheezing or shortness of breath.      Marland Kitchen. aspirin 81 MG tablet Take 81 mg by mouth daily.        Marland Kitchen. atorvastatin (LIPITOR) 20 MG tablet Take 20 mg by mouth daily before breakfast.      . cyclobenzaprine (FLEXERIL) 10 MG tablet Take 10 mg by mouth at bedtime as needed for muscle spasms (and sleep).      . gabapentin (NEURONTIN) 300 MG capsule Take 300 mg by mouth 3 (three) times daily.      Marland Kitchen. losartan-hydrochlorothiazide (HYZAAR) 100-25 MG per tablet Take 1 tablet by mouth daily before breakfast.      . Multiple Vitamin (MULTIVITAMIN) tablet Take 1 tablet by mouth daily.      Marland Kitchen. oxyCODONE (ROXICODONE) 15 MG immediate release  tablet Take 15 mg by mouth every 6 (six) hours as needed for pain.      . Rivaroxaban (XARELTO) 20 MG TABS tablet Take 1 tablet (20 mg total) by mouth daily.  30 tablet  6  . ranitidine (ZANTAC) 150 MG capsule Take 150 mg by mouth 2 (two) times daily as needed for heartburn.      . traMADol-acetaminophen (ULTRACET) 37.5-325 MG per tablet Take 1 tablet by mouth every 8 (eight) hours as needed.        No results found for this or any previous visit (from the past 48 hour(s)). No results found.  Review of systems not obtained due to patient factors.  Blood pressure 170/77, pulse 79, temperature 97.5 F (36.4 C), temperature source Oral, resp. rate 16, SpO2 100.00%.  The patient is awake or and oriented. His no facial asymmetry. His gait is extremely slow and  antalgic. 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 The plan is for a decompressive laminotomy.  Reinaldo Meekerandy O Izabelle Daus, MD 11/11/2013, 11:17 AM

## 2013-11-11 NOTE — Transfer of Care (Signed)
Immediate Anesthesia Transfer of Care Note  Patient: James Cabrera  Procedure(s) Performed: Procedure(s) with comments: LUMBAR LAMINECTOMY/DECOMPRESSION MICRODISCECTOMY 1 LEVEL,LUMBAR-FOUR-FIVE (Left) - left  Patient Location: PACU  Anesthesia Type:General  Level of Consciousness: awake  Airway & Oxygen Therapy: Patient Spontanous Breathing and Patient connected to nasal cannula oxygen  Post-op Assessment: Report given to PACU RN, Post -op Vital signs reviewed and stable and Patient moving all extremities  Post vital signs: Reviewed and stable  Complications: No apparent anesthesia complications

## 2013-11-11 NOTE — Anesthesia Procedure Notes (Signed)
Procedure Name: Intubation Date/Time: 11/11/2013 11:45 AM Performed by: Charm BargesBUTLER, Raihan Kimmel R Pre-anesthesia Checklist: Patient identified, Emergency Drugs available, Suction available, Patient being monitored and Timeout performed Patient Re-evaluated:Patient Re-evaluated prior to inductionOxygen Delivery Method: Circle system utilized Preoxygenation: Pre-oxygenation with 100% oxygen Intubation Type: IV induction Ventilation: Mask ventilation without difficulty and Oral airway inserted - appropriate to patient size Laryngoscope Size: Mac and 4 Tube type: Oral Tube size: 8.0 mm Number of attempts: 1 (Laryngoscopy and intubation by Arcelia Jew Sauve' CRNA) Airway Equipment and Method: Stylet Placement Confirmation: ETT inserted through vocal cords under direct vision,  positive ETCO2 and breath sounds checked- equal and bilateral Secured at: 22 cm Tube secured with: Tape Dental Injury: Teeth and Oropharynx as per pre-operative assessment

## 2013-11-11 NOTE — Anesthesia Postprocedure Evaluation (Signed)
  Anesthesia Post-op Note  Patient: James Cabrera  Procedure(s) Performed: Procedure(s) with comments: LUMBAR LAMINECTOMY/DECOMPRESSION MICRODISCECTOMY 1 LEVEL,LUMBAR-FOUR-FIVE (Left) - left  Patient Location: PACU  Anesthesia Type:General  Level of Consciousness: awake, alert  and oriented  Airway and Oxygen Therapy: Patient Spontanous Breathing and Patient connected to nasal cannula oxygen  Post-op Pain: mild  Post-op Assessment: Post-op Vital signs reviewed, Patient's Cardiovascular Status Stable, Respiratory Function Stable, Patent Airway and Pain level controlled  Post-op Vital Signs: stable  Last Vitals:  Filed Vitals:   11/11/13 1523  BP: 147/75  Pulse: 89  Temp: 36.5 C  Resp: 16    Complications: No apparent anesthesia complications

## 2013-11-11 NOTE — Anesthesia Preprocedure Evaluation (Signed)
Anesthesia Evaluation  Patient identified by MRN, date of birth, ID band Patient awake    Reviewed: Allergy & Precautions, H&P , NPO status , Patient's Chart, lab work & pertinent test results  Airway Mallampati: II TM Distance: >3 FB Neck ROM: Full    Dental  (+) Edentulous Upper, Edentulous Lower   Pulmonary Current Smoker,  breath sounds clear to auscultation        Cardiovascular hypertension, Rhythm:Regular Rate:Normal     Neuro/Psych    GI/Hepatic   Endo/Other    Renal/GU      Musculoskeletal   Abdominal   Peds  Hematology   Anesthesia Other Findings   Reproductive/Obstetrics                           Anesthesia Physical Anesthesia Plan  ASA: III  Anesthesia Plan: General   Post-op Pain Management:    Induction: Intravenous  Airway Management Planned: Oral ETT  Additional Equipment:   Intra-op Plan:   Post-operative Plan: Extubation in OR  Informed Consent: I have reviewed the patients History and Physical, chart, labs and discussed the procedure including the risks, benefits and alternatives for the proposed anesthesia with the patient or authorized representative who has indicated his/her understanding and acceptance.     Plan Discussed with: CRNA and Anesthesiologist  Anesthesia Plan Comments: ( HNP L4-5 GERD H/O Pulmonary embolism on Xarelto last took 4/14 Hypertension Smoker  Plan GA with oral ETT  Kipp Broodavid Shatara Stanek)        Anesthesia Quick Evaluation

## 2013-11-11 NOTE — Plan of Care (Signed)
Problem: Consults Goal: Diagnosis - Spinal Surgery Outcome: Completed/Met Date Met:  11/11/13 Lumbar Laminectomy (Complex)     

## 2013-11-11 NOTE — Op Note (Signed)
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 Localizing x-rays taken prior to incision to identify the appropriate level. Midline incision was made above the spinous processes of L4 and L5. Using Bovie cutting current the incision was carried on the spinous processes. Subperiosteal dissection was then carried out on the left side the spinous processes lamina facet joint self-retaining tract was placed for exposure. X-ray showed approach the appropriate level. Using the high-speed drill the inferior one half of the L4 lamina the medial one third of the facet joint and the superior one third of the L5 lamina were removed. Residual bone and hypertrophic ligamentum flavum removed. Ligament loss less than calcium within it. Thorough decompression was carried out on the left half of the thecal sac and L5 nerve roots on the left. This was inspected and found not to be herniated not in need of excision and removal. Once were checked for any evidence of residual compression and none could be identified. Irrigation was carried out and any bleeding controlled with upper coagulation and Gelfoam. The was then closed in multiple layers of Vicryl on the muscle fascia subcutaneous and subcuticular tissues. Dermabond Steri-Strips were placed on the skin. Shortness was then applied and the patient was extubated and taken to recovery room in stable condition.

## 2013-11-12 MED ORDER — OXYCODONE HCL 15 MG PO TABS: 15.0000 mg | ORAL_TABLET | ORAL | Status: DC | PRN

## 2013-11-12 NOTE — Progress Notes (Signed)
Pt doing very well. Pt and wife given D/C instructions with Rx, verbal understanding was given. Pt's IV was removed prior to D/C. Pt is stable @ D/C and has no other needs. Pt was D/C'd home via walking per MD order @ [phone]. Rema FendtAshley Francenia Chimenti, RN

## 2013-11-12 NOTE — Discharge Instructions (Signed)
Wound Care °Keep incision covered and dry for one week. °You may shower from the neck down. °You may remove outer bandage after one week.  °Do not put any creams, lotions, or ointments on incision. °Leave steri-strips on neck.  They will fall off by themselves. °Activity °Walk each and every day, increasing distance each day. °No lifting greater than 5 lbs.  Avoid bending, arching, and twisting. °No driving or riding in car until further notice at follow up appointment. °If provided with back brace, wear when out of bed.  It is not necessary to wear brace in bed. °Diet °Resume your normal diet.  °Return to Work °Will be discussed at you follow up appointment. °Call Your Doctor If Any of These Occur °Redness, drainage, or swelling at the wound.  °Temperature greater than 101 degrees. °Severe pain not relieved by pain medication. °Incision starts to come apart. °Follow Up Appt °Call today for appointment in 1-2 weeks ([phone]) or for problems.  If you have any hardware placed in your spine, you will need an x-ray before your appointment. °

## 2013-11-12 NOTE — Discharge Summary (Signed)
  Physician Discharge Summary  Patient ID: Francois W Quackenbush MRN: [id number] DOB/AGE: Mar 10, 1956 58 y.o.  Admit date: 11/11/2013 Discharge date: 11/12/2013  Admission Diagnoses:  Discharge Diagnoses:  Active Problems:   Lumbar spondylosis   Discharged Condition: good  Hospital Course: Surgery Tuesday for decompressive lam. Did well. Minimal pain post op. Ambulated well. Wound fine. Home pod 1, specific instructions given.  Consults: None  Significant Diagnostic Studies: none  Treatments: surgery: Left L 45 decompressive lam  Discharge Exam: Blood pressure 129/76, pulse 77, temperature 97.5 F (36.4 C), temperature source Oral, resp. rate 18, SpO2 96.00%. Incision/Wound:clean and dry; no new neuro issues  Disposition: 01-Home or Self Care   Future Appointments Provider Department Dept Phone   11/17/2013 8:30 AM [facility] [phone]       Medication List    ASK your doctor about these medications       albuterol 108 (90 BASE) MCG/ACT inhaler  Commonly known as:  PROVENTIL HFA;VENTOLIN HFA  Inhale 1 puff into the lungs every 6 (six) hours as needed for wheezing or shortness of breath.     aspirin 81 MG tablet  Take 81 mg by mouth daily.     atorvastatin 20 MG tablet  Commonly known as:  LIPITOR  Take 20 mg by mouth daily before breakfast.     cyclobenzaprine 10 MG tablet  Commonly known as:  FLEXERIL  Take 10 mg by mouth at bedtime as needed for muscle spasms (and sleep).     gabapentin 300 MG capsule  Commonly known as:  NEURONTIN  Take 300 mg by mouth 3 (three) times daily.     losartan-hydrochlorothiazide 100-25 MG per tablet  Commonly known as:  HYZAAR  Take 1 tablet by mouth daily before breakfast.     multivitamin tablet  Take 1 tablet by mouth daily.     oxyCODONE 15 MG immediate release tablet  Commonly known as:  ROXICODONE  Take 15 mg by mouth every 6 (six) hours as needed for pain.     ranitidine 150 MG  capsule  Commonly known as:  ZANTAC  Take 150 mg by mouth 2 (two) times daily as needed for heartburn.     rivaroxaban 20 MG Tabs tablet  Commonly known as:  XARELTO  Take 1 tablet (20 mg total) by mouth daily.     traMADol-acetaminophen 37.5-325 MG per tablet  Commonly known as:  ULTRACET  Take 1 tablet by mouth every 8 (eight) hours as needed.         At home rest most of the time. Get up 9 or 10 times each day and take a 15 or 20 minute walk. No riding in the car and to your first postoperative appointment. If you have neck surgery you may shower from the chest down starting on the third postoperative day. If you had back surgery he may start showering on the third postoperative day with saran wrap wrapped around your incisional area 3 times. After the shower remove the saran wrap. Take pain medicine as needed and other medications as instructed. Call my office for an appointment.  Signed: Reinaldo Meekerandy O Rickesha Veracruz, MD 11/12/2013, 8:44 AM

## 2013-11-17 ENCOUNTER — Ambulatory Visit (INDEPENDENT_AMBULATORY_CARE_PROVIDER_SITE_OTHER): Payer: BC Managed Care – PPO | Admitting: *Deleted

## 2013-11-17 DIAGNOSIS — Z7901 Long term (current) use of anticoagulants: Secondary | ICD-10-CM

## 2013-11-17 DIAGNOSIS — I2699 Other pulmonary embolism without acute cor pulmonale: Secondary | ICD-10-CM

## 2013-11-17 NOTE — Patient Instructions (Addendum)
6 month Xarelto followup  Py has lumber laminectomy by Dr Gerlene FeeKritzer 4/21.  He had pt stop Xarelto 7 days before procedure and held it 2 days after procedure.  Restarted Xarelto 4/23.  Reviewed patients medication list. Pt is not currently on any combined P-gp and strong CYP3A4 inhibitors/inducers (ketoconazole, traconazole, ritonavir, carbamazepine, phenytoin, rifampin, St. John's wort). Reviewed labs  11/05/13. SCr 1.26, Weight 235 , CrCl 98.70  Dose appropriate based on CrCl. Hgb and HCT 15.1/44.0  and stable.  Pt denies any bleeding, increased brusing or GI upset since starting Xarelto.  He will continue Xarelto 20mg  daily with evening meal and follow up with me in 6 months.

## 2013-11-18 ENCOUNTER — Encounter (HOSPITAL_COMMUNITY): Payer: Self-pay | Admitting: Neurosurgery

## 2014-01-06 ENCOUNTER — Telehealth: Payer: Self-pay | Admitting: Internal Medicine

## 2014-01-06 MED ORDER — RIVAROXABAN 20 MG PO TABS: 20.0000 mg | ORAL_TABLET | Freq: Every day | ORAL | Status: DC

## 2014-01-06 NOTE — Telephone Encounter (Signed)
Received fax refill request  Rx # [id number] Medication:  Xarelto 20 mg tablet Qty 30  Sig:  Take one tablet by mouth once daily Physician:  Tenny Crawoss

## 2014-01-06 NOTE — Telephone Encounter (Signed)
Medication sent via escribe.  

## 2014-05-05 IMAGING — CR DG CHEST 2V
2 series · 2 of 2 positions shown · non-contrast
Comparison: 04/09/2012

CLINICAL DATA: Preop

EXAM:
CHEST  2 VIEW

[w chest pa]
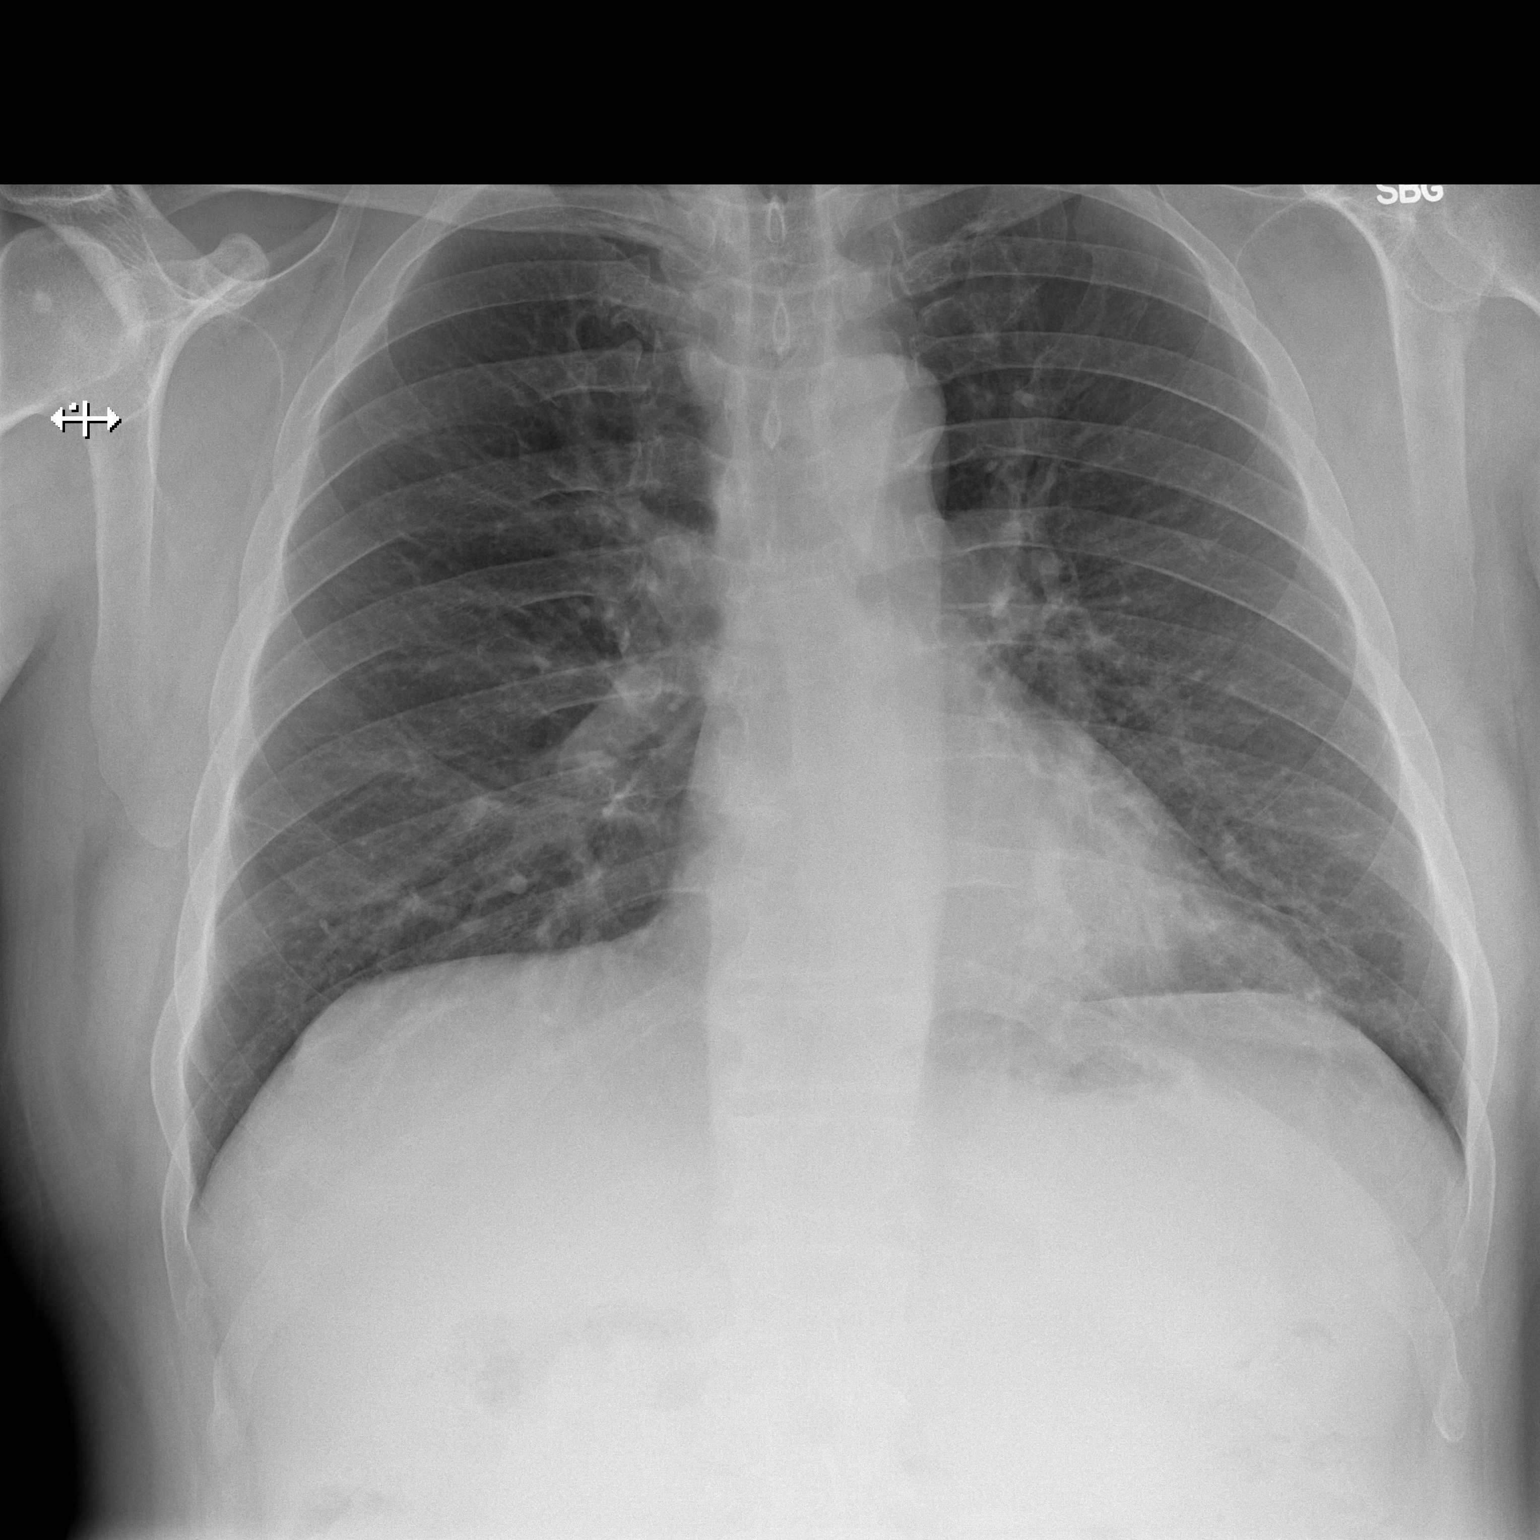

[w chest lat]
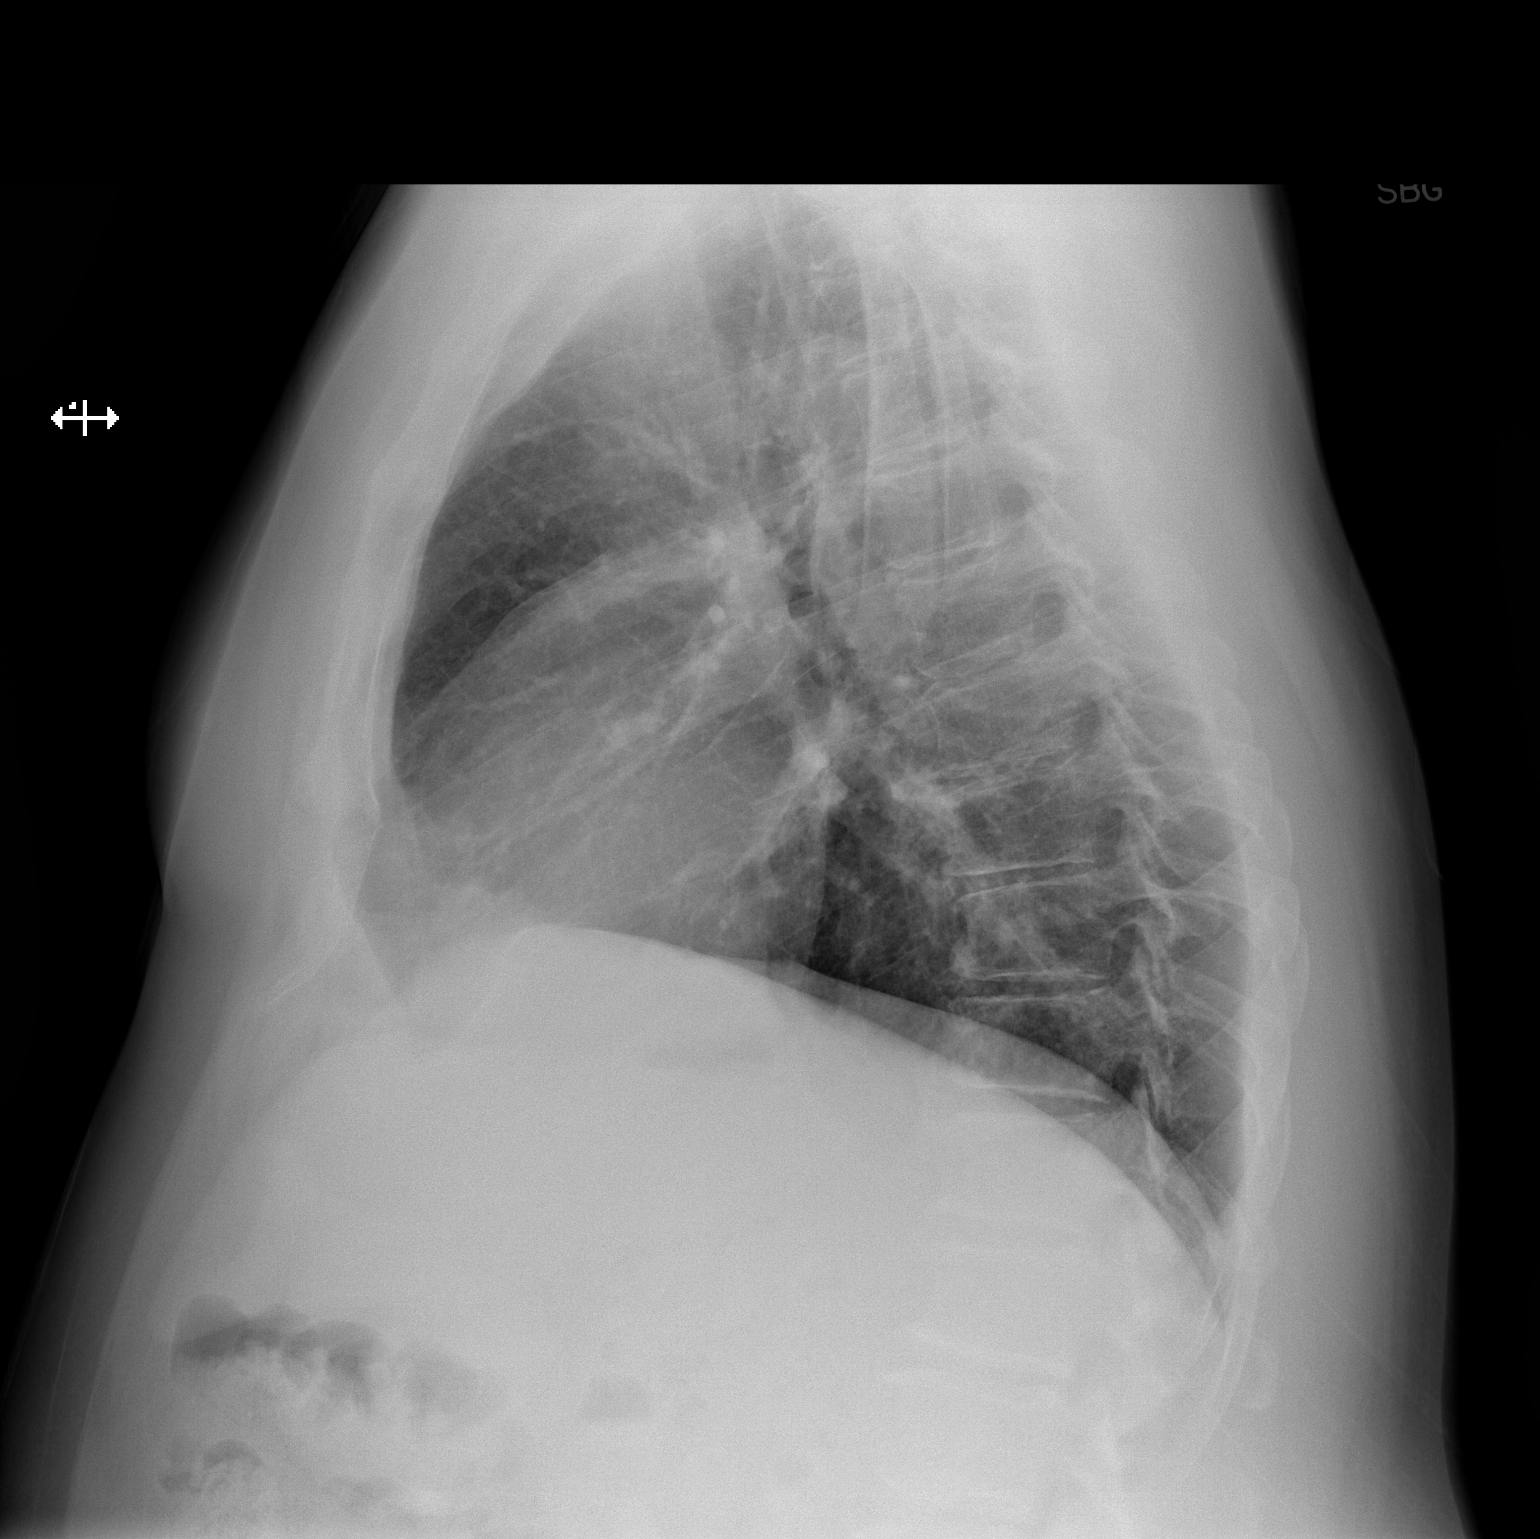

[2 of 2 positions shown; findings below may reference images not displayed]

FINDINGS: Cardiomediastinal silhouette is stable. No acute infiltrate or
pleural effusion. No pulmonary edema. Stable mild degenerative
changes thoracic spine.
IMPRESSION: No active cardiopulmonary disease.

## 2014-05-18 ENCOUNTER — Other Ambulatory Visit: Payer: Self-pay | Admitting: Adult Health

## 2014-05-18 ENCOUNTER — Ambulatory Visit (INDEPENDENT_AMBULATORY_CARE_PROVIDER_SITE_OTHER): Payer: BC Managed Care – PPO | Admitting: *Deleted

## 2014-05-18 DIAGNOSIS — I480 Paroxysmal atrial fibrillation: Secondary | ICD-10-CM

## 2014-05-18 LAB — CBC
HCT: 44.7 % (ref 39.0–52.0)
Hemoglobin: 15 g/dL (ref 13.0–17.0)
MCH: 35.1 pg — AB (ref 26.0–34.0)
MCHC: 33.6 g/dL (ref 30.0–36.0)
MCV: 104.7 fL — ABNORMAL HIGH (ref 78.0–100.0)
PLATELETS: 220 10*3/uL (ref 150–400)
RBC: 4.27 MIL/uL (ref 4.22–5.81)
RDW: 13.6 % (ref 11.5–15.5)
WBC: 9.6 10*3/uL (ref 4.0–10.5)

## 2014-05-18 LAB — LIPID PANEL
CHOLESTEROL: 119 mg/dL (ref 0–200)
HDL: 50 mg/dL (ref >39)
LDL Cholesterol: 47 mg/dL (ref 0–99)
TRIGLYCERIDES: 110 mg/dL (ref <150)
Total CHOL/HDL Ratio: 2.4 Ratio
VLDL: 22 mg/dL (ref 0–40)

## 2014-05-18 LAB — BASIC METABOLIC PANEL
BUN: 22 mg/dL (ref 6–23)
CALCIUM: 10 mg/dL (ref 8.4–10.5)
CHLORIDE: 102 meq/L (ref 96–112)
CO2: 26 mEq/L (ref 19–32)
Creat: 1.32 mg/dL (ref 0.50–1.35)
GLUCOSE: 66 mg/dL — AB (ref 70–99)
Potassium: 4.4 mEq/L (ref 3.5–5.3)
SODIUM: 140 meq/L (ref 135–145)

## 2014-05-18 NOTE — Patient Instructions (Addendum)
6 month Xarelto follow up since last visit on 11/17/13  Pt was started on Xarelto 04/18/13 for Pulmonary Embolism.    Reviewed patients medication list.  Pt  currently on any combined P-gp and strong CYP3A4 inhibitors/inducers (ketoconazole, traconazole, ritonavir, carbamazepine, phenytoin, rifampin, St. John's wort).  Reviewed labs from 05/18/14.  SCr 1.32  , Weight 197,  CrCl 78.98  .  Dose appropriate based on CrCl.   Hgb and HCT:  15.0/44.7  A full discussion of the nature of anticoagulants has been carried out.  A benefit/risk analysis has been presented to the patient, so that they understand the justification for choosing anticoagulation with Xarelto at this time.  The need for compliance is stressed.  Pt is aware to take the medication once daily with the largest meal of the day.  Side effects of potential bleeding are discussed, including unusual colored urine or stools, coughing up blood or coffee ground emesis, nose bleeds or serious fall or head trauma.  Discussed signs and symptoms of stroke. The patient should avoid any OTC items containing aspirin or ibuprofen.  Avoid alcohol consumption.   Call if any signs of abnormal bleeding.  Discussed financial obligations and resolved any difficulty in obtaining medication.  Next lab test test in 6 months.

## 2014-05-19 ENCOUNTER — Telehealth: Payer: Self-pay | Admitting: *Deleted

## 2014-05-19 NOTE — Telephone Encounter (Signed)
Message copied by Vernon PreyBARKER, Milynn Quirion T on Tue May 19, 2014  8:17 AM ------      Message from: Jodelle GrossLAWRENCE, KATHRYN M      Created: Mon May 18, 2014  4:47 PM       Labs reviewed. No changes ------

## 2014-05-19 NOTE — Telephone Encounter (Signed)
Pt wife aware, forwarded to Dr. Felecia ShellingFanta

## 2014-05-22 NOTE — Progress Notes (Signed)
HPI: [name] is a [age] year old patient of Dr. Dietrich PatesPaula Ross. We are following for ongoing assessment and management of hypertension, with history of DVT on Xarelto. He was last seen in the office on 05/22/2013 was doing well without any complaints of rapid palpitations, or bleeding symptoms. He is here for annual followup.  He has had back surgery in Sept for repair of L4-L5 disc. Has lost 30 lbs has he has been more active. He is without complaints of chest pain. He is medically compliant. He has had recent lab work completed 5 days ago. 140, 34.4, creatinine 1.3 to. Hemoglobin 15.6, hematocrit 44.7. Total cholesterol 119, LDL 47. He denies any bleeding issues with use of anticoagulation. He has had no new diagnoses, illnesses, or ER visits since being seen last.    Allergies  Allergen Reactions  . Penicillins Hives and Rash    ALMOST DIED/ COULDN'T REMEMBER EXACT PROBLEM    Current Outpatient Prescriptions  Medication Sig Dispense Refill  . albuterol (PROVENTIL HFA;VENTOLIN HFA) 108 (90 BASE) MCG/ACT inhaler Inhale 1 puff into the lungs every 6 (six) hours as needed for wheezing or shortness of breath.    Marland Kitchen. aspirin 81 MG tablet Take 81 mg by mouth daily.      Marland Kitchen. atorvastatin (LIPITOR) 20 MG tablet Take 20 mg by mouth daily before breakfast.    . cyclobenzaprine (FLEXERIL) 10 MG tablet Take 10 mg by mouth at bedtime as needed for muscle spasms (and sleep).    . losartan-hydrochlorothiazide (HYZAAR) 100-25 MG per tablet Take 1 tablet by mouth daily before breakfast.    . Multiple Vitamin (MULTIVITAMIN) tablet Take 1 tablet by mouth daily.    . ranitidine (ZANTAC) 150 MG capsule Take 150 mg by mouth 2 (two) times daily as needed for heartburn.    . rivaroxaban (XARELTO) 20 MG TABS tablet Take 1 tablet (20 mg total) by mouth daily. 30 tablet 6  . traMADol-acetaminophen (ULTRACET) 37.5-325 MG per tablet Take 1 tablet by mouth every 8 (eight) hours as needed.     No current  facility-administered medications for this visit.    Past Medical History  Diagnosis Date  . Acid reflux   . Complication of anesthesia     trouble breathing when I woke up & they had to put that thing on my face but then I was OK  . Hypertension   . Pulmonary embolism 2014    [facility] cared for pt., pt. reports that he was first on Heparin, then Coumadin & now Xarelto  . Peripheral vascular disease     after "knee surgery" many yrs. ago, he reports that he had a blood clot in his legs   . Pneumonia     hosp. x2 for pneumonia   . Asthma   . Shortness of breath   . Arthritis     legs, back   . History of stress test     [facility], told it was wnl    Past Surgical History  Procedure Laterality Date  . Knee arthroscopy Right     x2  . Lumbar laminectomy/decompression microdiscectomy Left 11/11/2013    Procedure: LUMBAR LAMINECTOMY/DECOMPRESSION MICRODISCECTOMY 1 LEVEL,LUMBAR-FOUR-FIVE;  Surgeon: Reinaldo Meekerandy O Kritzer, MD;  Location: [facility];  Service: Neurosurgery;  Laterality: Left;  left    ROS: Review of systems complete and found to be negative unless listed above  PHYSICAL EXAM BP 128/72 mmHg  Pulse 75  Ht 5\' 10"  (1.778 m)  Wt 201 lb (91.173 kg)  BMI 28.84 kg/m2 General: Well developed, well nourished, in no acute distress Head: Eyes PERRLA, No xanthomas.   Normal cephalic and atramatic  Lungs: Clear bilaterally to auscultation and percussion. Heart: HRRR S1 S2, without MRG.  Pulses are 2+ & equal.            No carotid bruit. No JVD.  No abdominal bruits. No femoral bruits. Abdomen: Bowel sounds are positive, abdomen soft and non-tender without masses or                  Hernia's noted. Msk:  Back normal, normal gait. Normal strength and tone for age. Extremities: No clubbing, cyanosis or edema.  DP +1 Neuro: Alert and oriented X 3. Psych:  Good affect, responds appropriately   ASSESSMENT AND PLAN

## 2014-05-25 ENCOUNTER — Ambulatory Visit (INDEPENDENT_AMBULATORY_CARE_PROVIDER_SITE_OTHER): Payer: BC Managed Care – PPO | Admitting: Adult Health

## 2014-05-25 ENCOUNTER — Encounter: Payer: Self-pay | Admitting: Adult Health

## 2014-05-25 VITALS — BP 128/72 | HR 75 | Ht 70.0 in | Wt 201.0 lb

## 2014-05-25 DIAGNOSIS — I82401 Acute embolism and thrombosis of unspecified deep veins of right lower extremity: Secondary | ICD-10-CM

## 2014-05-25 MED ORDER — LOSARTAN POTASSIUM-HCTZ 100-25 MG PO TABS: 1.0000 | ORAL_TABLET | Freq: Every day | ORAL | Status: DC

## 2014-05-25 MED ORDER — ATORVASTATIN CALCIUM 20 MG PO TABS: 20.0000 mg | ORAL_TABLET | Freq: Every day | ORAL | Status: DC

## 2014-05-25 MED ORDER — RIVAROXABAN 20 MG PO TABS: 20.0000 mg | ORAL_TABLET | Freq: Every day | ORAL | Status: DC

## 2014-05-25 NOTE — Patient Instructions (Signed)
Your physician wants you to follow-up in: 1 year. You will receive a reminder letter in the mail two months in advance. If you don't receive a letter, please call our office to schedule the follow-up appointment.  Your physician recommends that you continue on your current medications as directed. Please refer to the Current Medication list given to you today.  I have refilled your medications  We have given you a copy of your lab results  Thank you for choosing [facility]!!

## 2014-05-25 NOTE — Assessment & Plan Note (Signed)
Doing well. He has had no recurrent symptoms of pain in his leg shortness breath or chest pain, associated with DVT or PE. He continues on anticoagulation therapy, and is responding well to it without side effects. Review of labs does not show evidence of anemia, elevated LFTs, or kidney disease. We will continue him on his current medication regimen and see him again in one year unless he is symptomatic.

## 2014-05-25 NOTE — Progress Notes (Deleted)
Name: James Cabrera    DOB: 01/18/1956  Age: 58 y.o.  MR#: [id number]       PCP:  Avon GullyFANTA,TESFAYE, MD      Insurance: Payor: BLUE CROSS BLUE SHIELD / Plan: BCBS [place] PPO / Product Type: *No Product type* /   CC:    Chief Complaint  Patient presents with  . Hypertension  . DVT    VS Filed Vitals:   05/25/14 1422  BP: 128/72  Pulse: 75  Height: 5\' 10"  (1.778 m)  Weight: 201 lb (91.173 kg)    Weights Current Weight  05/25/14 201 lb (91.173 kg)  11/05/13 235 lb 3.7 oz (106.7 kg)  05/22/13 235 lb (106.595 kg)    Blood Pressure  BP Readings from Last 3 Encounters:  05/25/14 128/72  11/12/13 129/76  11/05/13 152/83     Admit date:  (Not on file) Last encounter with RMR:  Visit date not found   Allergy Penicillins  Current Outpatient Prescriptions  Medication Sig Dispense Refill  . albuterol (PROVENTIL HFA;VENTOLIN HFA) 108 (90 BASE) MCG/ACT inhaler Inhale 1 puff into the lungs every 6 (six) hours as needed for wheezing or shortness of breath.    Marland Kitchen. aspirin 81 MG tablet Take 81 mg by mouth daily.      Marland Kitchen. atorvastatin (LIPITOR) 20 MG tablet Take 20 mg by mouth daily before breakfast.    . cyclobenzaprine (FLEXERIL) 10 MG tablet Take 10 mg by mouth at bedtime as needed for muscle spasms (and sleep).    . losartan-hydrochlorothiazide (HYZAAR) 100-25 MG per tablet Take 1 tablet by mouth daily before breakfast.    . Multiple Vitamin (MULTIVITAMIN) tablet Take 1 tablet by mouth daily.    . ranitidine (ZANTAC) 150 MG capsule Take 150 mg by mouth 2 (two) times daily as needed for heartburn.    . rivaroxaban (XARELTO) 20 MG TABS tablet Take 1 tablet (20 mg total) by mouth daily. 30 tablet 6  . traMADol-acetaminophen (ULTRACET) 37.5-325 MG per tablet Take 1 tablet by mouth every 8 (eight) hours as needed.     No current facility-administered medications for this visit.    Discontinued Meds:    Medications Discontinued During This Encounter  Medication Reason  . gabapentin  (NEURONTIN) 300 MG capsule Error  . oxyCODONE (ROXICODONE) 15 MG immediate release tablet Error  . oxyCODONE (ROXICODONE) 15 MG immediate release tablet Error    Patient Active Problem List   Diagnosis Date Noted  . Lumbar spondylosis 11/11/2013  . 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 Long term (current) use of anticoagulants 04/17/2012    LABS    Component Value Date/Time   NA 140 05/18/2014 0758   NA 142 11/05/2013 1112   NA 138 05/22/2013 1436   K 4.4 05/18/2014 0758   K 4.6 11/05/2013 1112   K 4.2 05/22/2013 1436   CL 102 05/18/2014 0758   CL 103 11/05/2013 1112   CL 99 05/22/2013 1436   CO2 26 05/18/2014 0758   CO2 27 11/05/2013 1112   CO2 26 05/22/2013 1436   GLUCOSE 66* 05/18/2014 0758   GLUCOSE 96 11/05/2013 1112   GLUCOSE 97 05/22/2013 1436   BUN 22 05/18/2014 0758   BUN 23 11/05/2013 1112   BUN 35* 05/22/2013 1436   CREATININE 1.32 05/18/2014 0758   CREATININE 1.26 11/05/2013 1112   CREATININE 1.28 05/22/2013 1436   CREATININE 1.15 04/14/2013 1122   CALCIUM 10.0 05/18/2014 0758   CALCIUM 10.0 11/05/2013 1112  CALCIUM 9.9 05/22/2013 1436   GFRNONAA 62* 11/05/2013 1112   GFRAA 72* 11/05/2013 1112   CMP     Component Value Date/Time   NA 140 05/18/2014 0758   K 4.4 05/18/2014 0758   CL 102 05/18/2014 0758   CO2 26 05/18/2014 0758   GLUCOSE 66* 05/18/2014 0758   BUN 22 05/18/2014 0758   CREATININE 1.32 05/18/2014 0758   CREATININE 1.26 11/05/2013 1112   CALCIUM 10.0 05/18/2014 0758   GFRNONAA 62* 11/05/2013 1112   GFRAA 72* 11/05/2013 1112       Component Value Date/Time   WBC 9.6 05/18/2014 0758   WBC 7.5 11/05/2013 1112   WBC 10.6* 05/22/2013 1436   HGB 15.0 05/18/2014 0758   HGB 15.1 11/05/2013 1112   HGB 15.4 05/22/2013 1436   HCT 44.7 05/18/2014 0758   HCT 44.0 11/05/2013 1112   HCT 44.1 05/22/2013 1436   MCV 104.7* 05/18/2014 0758   MCV 98.7 11/05/2013 1112   MCV 99.1 05/22/2013 1436    Lipid Panel     Component  Value Date/Time   CHOL 119 05/18/2014 0758   TRIG 110 05/18/2014 0758   HDL 50 05/18/2014 0758   CHOLHDL 2.4 05/18/2014 0758   VLDL 22 05/18/2014 0758   LDLCALC 47 05/18/2014 0758    ABG No results found for: PHART, PCO2ART, PO2ART, HCO3, TCO2, ACIDBASEDEF, O2SAT   No results found for: TSH BNP (last 3 results) No results for input(s): PROBNP in the last 8760 hours. Cardiac Panel (last 3 results) No results for input(s): CKTOTAL, CKMB, TROPONINI, RELINDX in the last 72 hours.  Iron/TIBC/Ferritin/ %Sat No results found for: IRON, TIBC, FERRITIN, IRONPCTSAT   EKG Orders placed or performed during the hospital encounter of 11/05/13  . EKG 12 lead  . EKG 12 lead     Prior Assessment and Plan Problem List as of 05/25/2014      Cardiovascular and Mediastinum   Other pulmonary embolism and infarction   Last Assessment & Plan   05/22/2013 Office Visit Written 05/22/2013  2:35 PM by Jodelle GrossKathryn M Lawrence, NP    He is doing well. Denies bleeding issues with Xarelto.  Will check a CBC and BMET now that he has been on this for one month for kidney fx and for anemia.       Musculoskeletal and Integument   Lumbar spondylosis     Other   Long term (current) use of anticoagulants       Imaging: No results found.

## 2014-10-09 ENCOUNTER — Other Ambulatory Visit: Payer: Self-pay | Admitting: Adult Health

## 2015-01-18 DIAGNOSIS — I950 Idiopathic hypotension: Secondary | ICD-10-CM | POA: Insufficient documentation

## 2015-01-18 DIAGNOSIS — M549 Dorsalgia, unspecified: Secondary | ICD-10-CM | POA: Insufficient documentation

## 2015-01-18 DIAGNOSIS — R29898 Other symptoms and signs involving the musculoskeletal system: Secondary | ICD-10-CM | POA: Insufficient documentation

## 2015-01-18 DIAGNOSIS — M9979 Connective tissue and disc stenosis of intervertebral foramina of abdomen and other regions: Secondary | ICD-10-CM | POA: Insufficient documentation

## 2015-01-18 DIAGNOSIS — S14129A Central cord syndrome at unspecified level of cervical spinal cord, initial encounter: Secondary | ICD-10-CM | POA: Insufficient documentation

## 2015-01-26 DIAGNOSIS — I82512 Chronic embolism and thrombosis of left femoral vein: Secondary | ICD-10-CM | POA: Insufficient documentation

## 2015-06-28 ENCOUNTER — Other Ambulatory Visit: Payer: Self-pay | Admitting: Adult Health

## 2015-07-05 ENCOUNTER — Encounter: Payer: Self-pay | Admitting: Adult Health

## 2015-07-05 ENCOUNTER — Ambulatory Visit (INDEPENDENT_AMBULATORY_CARE_PROVIDER_SITE_OTHER): Payer: BC Managed Care – PPO | Admitting: Adult Health

## 2015-07-05 VITALS — BP 140/66 | HR 91 | Ht 70.0 in | Wt 173.4 lb

## 2015-07-05 DIAGNOSIS — I10 Essential (primary) hypertension: Secondary | ICD-10-CM

## 2015-07-05 MED ORDER — ATORVASTATIN CALCIUM 20 MG PO TABS: 20.0000 mg | ORAL_TABLET | Freq: Every day | ORAL | Status: AC

## 2015-07-05 MED ORDER — RIVAROXABAN 20 MG PO TABS: 20.0000 mg | ORAL_TABLET | Freq: Every day | ORAL | Status: DC

## 2015-07-05 NOTE — Progress Notes (Deleted)
Name: James Cabrera    DOB: [birth year]  Age: 59 y.o.  MR#: [id number]       PCP:  Harlan StainsOE,LORI, MD      Insurance: Payor: MEDICARE / Plan: MEDICARE PART A AND B / Product Type: *No Product type* /   CC:   No chief complaint on file.   VS Filed Vitals:   07/05/15 1445  BP: 140/66  Pulse: 91  Height: 5\' 10"  (1.778 m)  Weight: 173 lb 6.4 oz (78.654 kg)  SpO2: 99%    Weights Current Weight  07/05/15 173 lb 6.4 oz (78.654 kg)  05/25/14 201 lb (91.173 kg)  11/05/13 235 lb 3.7 oz (106.7 kg)    Blood Pressure  BP Readings from Last 3 Encounters:  07/05/15 140/66  05/25/14 128/72  11/12/13 129/76     Admit date:  (Not on file) Last encounter with RMR:  06/28/2015   Allergy Penicillins  Current Outpatient Prescriptions  Medication Sig Dispense Refill  . albuterol (PROVENTIL HFA;VENTOLIN HFA) 108 (90 BASE) MCG/ACT inhaler Inhale 1 puff into the lungs every 6 (six) hours as needed for wheezing or shortness of breath.    Marland Kitchen. aspirin 81 MG tablet Take 81 mg by mouth daily.      Marland Kitchen. atorvastatin (LIPITOR) 20 MG tablet TAKE 1 TABLET DAILY BEFORE BREAKFAST 30 tablet 0  . diazepam (VALIUM) 5 MG tablet Take by mouth.    . DULoxetine (CYMBALTA) 30 MG capsule Take by mouth.    . gabapentin (NEURONTIN) 400 MG capsule Take by mouth.    . losartan-hydrochlorothiazide (HYZAAR) 100-25 MG per tablet TAKE 1 TABLET BEFORE BREAKFAST 30 tablet 11  . Multiple Vitamin (MULTIVITAMIN) tablet Take 1 tablet by mouth daily.    . rivaroxaban (XARELTO) 20 MG TABS tablet Take 1 tablet (20 mg total) by mouth daily. 30 tablet 6   No current facility-administered medications for this visit.    Discontinued Meds:    Medications Discontinued During This Encounter  Medication Reason  . cyclobenzaprine (FLEXERIL) 10 MG tablet Error  . ranitidine (ZANTAC) 150 MG capsule Error  . traMADol-acetaminophen (ULTRACET) 37.5-325 MG per tablet Error    Patient Active Problem List   Diagnosis Date Noted  . Lumbar  spondylosis 11/11/2013  . 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 Long term (current) use of anticoagulants 04/17/2012    LABS    Component Value Date/Time   NA 140 05/18/2014 0758   NA 142 11/05/2013 1112   NA 138 05/22/2013 1436   K 4.4 05/18/2014 0758   K 4.6 11/05/2013 1112   K 4.2 05/22/2013 1436   CL 102 05/18/2014 0758   CL 103 11/05/2013 1112   CL 99 05/22/2013 1436   CO2 26 05/18/2014 0758   CO2 27 11/05/2013 1112   CO2 26 05/22/2013 1436   GLUCOSE 66* 05/18/2014 0758   GLUCOSE 96 11/05/2013 1112   GLUCOSE 97 05/22/2013 1436   BUN 22 05/18/2014 0758   BUN 23 11/05/2013 1112   BUN 35* 05/22/2013 1436   CREATININE 1.32 05/18/2014 0758   CREATININE 1.26 11/05/2013 1112   CREATININE 1.28 05/22/2013 1436   CREATININE 1.15 04/14/2013 1122   CALCIUM 10.0 05/18/2014 0758   CALCIUM 10.0 11/05/2013 1112   CALCIUM 9.9 05/22/2013 1436   GFRNONAA 62* 11/05/2013 1112   GFRAA 72* 11/05/2013 1112   CMP     Component Value Date/Time   NA 140 05/18/2014 0758   K 4.4 05/18/2014 0758   CL 102  05/18/2014 0758   CO2 26 05/18/2014 0758   GLUCOSE 66* 05/18/2014 0758   BUN 22 05/18/2014 0758   CREATININE 1.32 05/18/2014 0758   CREATININE 1.26 11/05/2013 1112   CALCIUM 10.0 05/18/2014 0758   GFRNONAA 62* 11/05/2013 1112   GFRAA 72* 11/05/2013 1112       Component Value Date/Time   WBC 9.6 05/18/2014 0758   WBC 7.5 11/05/2013 1112   WBC 10.6* 05/22/2013 1436   HGB 15.0 05/18/2014 0758   HGB 15.1 11/05/2013 1112   HGB 15.4 05/22/2013 1436   HCT 44.7 05/18/2014 0758   HCT 44.0 11/05/2013 1112   HCT 44.1 05/22/2013 1436   MCV 104.7* 05/18/2014 0758   MCV 98.7 11/05/2013 1112   MCV 99.1 05/22/2013 1436    Lipid Panel     Component Value Date/Time   CHOL 119 05/18/2014 0758   TRIG 110 05/18/2014 0758   HDL 50 05/18/2014 0758   CHOLHDL 2.4 05/18/2014 0758   VLDL 22 05/18/2014 0758   LDLCALC 47 05/18/2014 0758    ABG No results found for: PHART,  PCO2ART, PO2ART, HCO3, TCO2, ACIDBASEDEF, O2SAT   No results found for: TSH BNP (last 3 results) No results for input(s): BNP in the last 8760 hours.  ProBNP (last 3 results) No results for input(s): PROBNP in the last 8760 hours.  Cardiac Panel (last 3 results) No results for input(s): CKTOTAL, CKMB, TROPONINI, RELINDX in the last 72 hours.  Iron/TIBC/Ferritin/ %Sat No results found for: IRON, TIBC, FERRITIN, IRONPCTSAT   EKG Orders placed or performed in visit on 07/05/15  . EKG 12-Lead     Prior Assessment and Plan Problem List as of 07/05/2015      Cardiovascular and Mediastinum   Other pulmonary embolism and infarction   Last Assessment & Plan 05/25/2014 Office Visit Written 05/25/2014  2:46 PM by Jodelle Gross, NP    Doing well. He has had no recurrent symptoms of pain in his leg shortness breath or chest pain, associated with DVT or PE. He continues on anticoagulation therapy, and is responding well to it without side effects. Review of labs does not show evidence of anemia, elevated LFTs, or kidney disease. We will continue him on his current medication regimen and see him again in one year unless he is symptomatic.        Musculoskeletal and Integument   Lumbar spondylosis     Other   Long term (current) use of anticoagulants       Imaging: No results found.

## 2015-07-05 NOTE — Patient Instructions (Signed)
Your physician wants you to follow-up in: 1 year You will receive a reminder letter in the mail two months in advance. If you don't receive a letter, please call our office to schedule the follow-up appointment.   Your physician recommends that you continue on your current medications as directed. Please refer to the Current Medication list given to you today.    If you need a refill on your cardiac medications before your next appointment, please call your pharmacy.      Thank you for choosing [facility] !        

## 2015-07-05 NOTE — Progress Notes (Signed)
Cardiology Office Note   Date:  07/05/2015   ID:  James Cabrera, DOB 07-28-1955, MRN [id number]  PCP:  Harlan StainsOE,LORI, MD  Cardiologist:  Rojelio Brenneross/Kathryn Lawrence, NP   No chief complaint on file.     History of Present Illness: James Cabrera is a 59 y.o. male who presents for for ongoing assessment and management of hypertension, with history of DVT on Xarelto. He is here for annual follow up. He has had a fall resulting in a severe neck injury in June. He had cervical spine crushed and is being [facility] for initial surgery and ongoing management. He is wearing a neck brace. He has lost 40 lbs. He has no complaints of chest pain. He continues to have some ataxia. He is medically compliant.   Past Medical History  Diagnosis Date  . Acid reflux   . Complication of anesthesia     trouble breathing when I woke up & they had to put that thing on my face but then I was OK  . Hypertension   . Pulmonary embolism (HCC) 2014    [facility] cared for pt., pt. reports that he was first on Heparin, then Coumadin & now Xarelto  . Peripheral vascular disease (HCC)     after "knee surgery" many yrs. ago, he reports that he had a blood clot in his legs   . Pneumonia     hosp. x2 for pneumonia   . Asthma   . Shortness of breath   . Arthritis     legs, back   . History of stress test     [facility], [facility], told it was wnl    Past Surgical History  Procedure Laterality Date  . Knee arthroscopy Right     x2  . Lumbar laminectomy/decompression microdiscectomy Left 11/11/2013    Procedure: LUMBAR LAMINECTOMY/DECOMPRESSION MICRODISCECTOMY 1 LEVEL,LUMBAR-FOUR-FIVE;  Surgeon: Reinaldo Meekerandy O Kritzer, MD;  Location: [facility];  Service: Neurosurgery;  Laterality: Left;  left     Current Outpatient Prescriptions  Medication Sig Dispense Refill  . albuterol (PROVENTIL HFA;VENTOLIN HFA) 108 (90 BASE) MCG/ACT inhaler Inhale 1 puff into the lungs every 6 (six) hours as needed for wheezing or  shortness of breath.    Marland Kitchen. aspirin 81 MG tablet Take 81 mg by mouth daily.      Marland Kitchen. atorvastatin (LIPITOR) 20 MG tablet TAKE 1 TABLET DAILY BEFORE BREAKFAST 30 tablet 0  . diazepam (VALIUM) 5 MG tablet Take by mouth.    . DULoxetine (CYMBALTA) 30 MG capsule Take by mouth.    . gabapentin (NEURONTIN) 400 MG capsule Take by mouth.    . losartan-hydrochlorothiazide (HYZAAR) 100-25 MG per tablet TAKE 1 TABLET BEFORE BREAKFAST 30 tablet 11  . Multiple Vitamin (MULTIVITAMIN) tablet Take 1 tablet by mouth daily.    . rivaroxaban (XARELTO) 20 MG TABS tablet Take 1 tablet (20 mg total) by mouth daily. 30 tablet 6   No current facility-administered medications for this visit.    Allergies:   Penicillins    Social History:  The patient  reports that he has been smoking Cigarettes.  He has a 11.1 pack-year smoking history. He has never used smokeless tobacco. He reports that he drinks alcohol. He reports that he does not use illicit drugs.   Family History:  The patient's family history is not on file.    ROS: All other systems are reviewed and negative. Unless otherwise mentioned in H&P    PHYSICAL EXAM: VS:  BP 140/66 mmHg  Pulse 91  Ht  (1.778 m)  Wt 173 lb 6.4 oz (78.654 kg)  BMI 24.88 kg/m2  SpO2 99% , BMI Body mass index is 24.88 kg/(m^2). GEN: Well nourished, well developed, in no acute distress HEENT: normal Neck: no JVD, carotid bruits, or masses Cardiac: RRR; no murmurs, rubs, or gallops,no edema  Respiratory:  clear to auscultation bilaterally, normal work of breathing GI: soft, nontender, nondistended, + BS MS: no deformity or atrophyWearing neck brace.  Skin: warm and dry, no rash Neuro:  Strength and sensation are diminished.  Psych: euthymic mood, full affect   EKG:  The ekg ordered today demonstrates NSR rate of 99 bpm. Nonspecific T-wave abnormalities.    Recent Labs: No results found for requested labs within last 365 days.    Lipid Panel    Component  Value Date/Time   CHOL 119 05/18/2014 0758   TRIG 110 05/18/2014 0758   HDL 50 05/18/2014 0758   CHOLHDL 2.4 05/18/2014 0758   VLDL 22 05/18/2014 0758   LDLCALC 47 05/18/2014 0758      Wt Readings from Last 3 Encounters:  07/05/15 173 lb 6.4 oz (78.654 kg)  05/25/14 201 lb (91.173 kg)  11/05/13 235 lb 3.7 oz (106.7 kg)     ASSESSMENT AND PLAN:  1. Hypertension: Slightly elevated today. He is still having some neck pain and ataxia. Will not make any changes in medication regimen at this time unless he is symptomatic.   2. Pulmonary Embolism: Continue Xarelto for now as he is continuing recovery from neck surgery and is becoming more sedentary. May be able to stop in another year.   3. Hypercholesterolemia: He will continue statin therapy.       Current medicines are reviewed at length with the patient today.    Labs/ tests ordered today include:None  Orders Placed This Encounter  Procedures  . EKG 12-Lead     Disposition:   FU with cardiology in 6 months.  Signed, Joni Reining, NP  07/05/2015 2:54 PM    [facility] [address], [place], [place] [address] Phone: [phone]; Fax: [phone]

## 2016-02-14 ENCOUNTER — Other Ambulatory Visit: Payer: Self-pay | Admitting: Ophthalmology

## 2016-02-14 DIAGNOSIS — R7989 Other specified abnormal findings of blood chemistry: Secondary | ICD-10-CM

## 2016-02-14 DIAGNOSIS — R945 Abnormal results of liver function studies: Principal | ICD-10-CM

## 2016-02-18 ENCOUNTER — Ambulatory Visit
Admission: RE | Admit: 2016-02-18 | Discharge: 2016-02-18 | Disposition: A | Discharge status: Home / Self Care | Payer: Medicare Other | Source: Ambulatory Visit | Attending: Ophthalmology | Admitting: Ophthalmology

## 2016-02-18 DIAGNOSIS — R7989 Other specified abnormal findings of blood chemistry: Secondary | ICD-10-CM

## 2016-02-18 DIAGNOSIS — R945 Abnormal results of liver function studies: Principal | ICD-10-CM

## 2016-07-24 HISTORY — PX: ANTERIOR FUSION CERVICAL SPINE: SUR626

## 2016-11-28 ENCOUNTER — Other Ambulatory Visit: Payer: Self-pay | Admitting: Family Medicine

## 2016-11-28 DIAGNOSIS — E871 Hypo-osmolality and hyponatremia: Secondary | ICD-10-CM

## 2016-11-28 DIAGNOSIS — R413 Other amnesia: Secondary | ICD-10-CM

## 2016-12-01 ENCOUNTER — Other Ambulatory Visit: Payer: BLUE CROSS/BLUE SHIELD

## 2016-12-07 ENCOUNTER — Ambulatory Visit
Admission: RE | Admit: 2016-12-07 | Discharge: 2016-12-07 | Disposition: A | Discharge status: Home / Self Care | Payer: BLUE CROSS/BLUE SHIELD | Source: Ambulatory Visit | Attending: Family Medicine | Admitting: Family Medicine

## 2016-12-07 ENCOUNTER — Other Ambulatory Visit: Payer: BLUE CROSS/BLUE SHIELD

## 2016-12-07 DIAGNOSIS — E871 Hypo-osmolality and hyponatremia: Secondary | ICD-10-CM

## 2016-12-07 DIAGNOSIS — R413 Other amnesia: Secondary | ICD-10-CM

## 2016-12-29 ENCOUNTER — Encounter: Payer: Self-pay | Admitting: Physician Assistant

## 2016-12-29 ENCOUNTER — Ambulatory Visit: Payer: BLUE CROSS/BLUE SHIELD | Admitting: Physician Assistant

## 2016-12-29 ENCOUNTER — Telehealth: Payer: Self-pay | Admitting: *Deleted

## 2016-12-29 NOTE — Progress Notes (Deleted)
Cardiology Office Note    Date:  12/29/2016  ID:  James Cabrera, Poitras 1955/12/04, MRN [id number] PCP:  Ailene Ravel, MD  Cardiologist:  Remotely seen by Dr. Tenny Craw   Chief Complaint: passing out  History of Present Illness:  James Cabrera is a 61 y.o. male with history of HTN, prior DVT with recurrent PE 2013 (lifelong anticoagulation recommended), acid reflux, asthma, arthritis, RLS, hyperlipidemia, testicular hypofunction, recent anemia, CKD III varicose veins, dementia per PCP note who presents for evaluation of syncope at the request of Dr. Nathanial Rancher.  Per review of records from primary care, he had labs done 11/24/16 showing Hgb 11.3 (down from 14.6 in 04/2016 per notes), BUN 21, Cr 1.50 (previously 1.66 per notes), Na 131 (down from 133 previously) - it was felt perhaps lab changes were due to fluid overload and dilutional effect so Lasix was increased to 1 tablet daily instead of 1/2 daily. Recommendation regarding progressive anemia was to consider colonoscopy in the future). CT chest 12/07/16 done for cough and hyponatremia showed moderate calcification in the proximal left subclavian artery, atherosclerotic calcification in the aorta, foci of coronary artery calcification, stable 2mm nodular lesion RUL, hepatic steatosis. He was seen by Dr. Nathanial Rancher 12/21/16 wreporting an episode of chest pain followed by syncope. He recently had CT that also noted CAD. He also has had some dementia issues recently and brain CT showed diffuse atrophy and small vessel disease but no other significant abnormalities; neurology eval pending. He was remotely seen by Dr. Tenny Craw for HTN. Repeat labs 12/21/16 showed ***   Syncope CAD  Acute kidney injury superimposed on probable CKD stage III Anemia of unknown etiology H/o DVT/PE Essential HTN    Past Medical History:  Diagnosis Date  . Acid reflux   . Arthritis    legs, back   . Asthma   . CKD (chronic kidney disease), stage III   . Complication of  anesthesia    trouble breathing when I woke up & they had to put that thing on my face but then I was OK  . DVT (deep vein thrombosis) in pregnancy (HCC)   . History of stress test    done at [facility], post PE, told it was wnl  . Hypertension   . Hyponatremia   . Pneumonia    hosp. x2 for pneumonia   . Pulmonary embolism (HCC) 2013  . Shortness of breath     Past Surgical History:  Procedure Laterality Date  . KNEE ARTHROSCOPY Right    x2  . LUMBAR LAMINECTOMY/DECOMPRESSION MICRODISCECTOMY Left 11/11/2013   Procedure: LUMBAR LAMINECTOMY/DECOMPRESSION MICRODISCECTOMY 1 LEVEL,LUMBAR-FOUR-FIVE;  Surgeon: Reinaldo Meeker, MD;  Location: [facility];  Service: Neurosurgery;  Laterality: Left;  left    Current Medications: Current Outpatient Prescriptions  Medication Sig Dispense Refill  . albuterol (PROVENTIL HFA;VENTOLIN HFA) 108 (90 BASE) MCG/ACT inhaler Inhale 1 puff into the lungs every 6 (six) hours as needed for wheezing or shortness of breath.    Marland Kitchen aspirin 81 MG tablet Take 81 mg by mouth daily.      Marland Kitchen atorvastatin (LIPITOR) 20 MG tablet Take 1 tablet (20 mg total) by mouth daily before breakfast. 30 tablet 11  . DULoxetine (CYMBALTA) 30 MG capsule Take by mouth.    . losartan-hydrochlorothiazide (HYZAAR) 100-25 MG per tablet TAKE 1 TABLET BEFORE BREAKFAST 30 tablet 11  . Multiple Vitamin (MULTIVITAMIN) tablet Take 1 tablet by mouth daily.    . rivaroxaban (XARELTO) 20 MG  TABS tablet Take 1 tablet (20 mg total) by mouth daily. 30 tablet 11   No current facility-administered medications for this visit.      Allergies:   Penicillins   Social History   Social History  . Marital status: Married    Spouse name: N/A  . Number of children: N/A  . Years of education: N/A   Social History Main Topics  . Smoking status: Current Every Day Smoker    Packs/day: 0.30    Years: 37.00    Types: Cigarettes  . Smokeless tobacco: Never Used     Comment: 4 cigarettes daily for the  past 3 days  . Alcohol use 0.0 oz/week     Comment: occas. - for holidays   . Drug use: No  . Sexual activity: Not on file   Other Topics Concern  . Not on file   Social History Narrative  . No narrative on file     Family History:  No family history on file. ***  ROS:   Please see the history of present illness. Otherwise, review of systems is positive for ***.  All other systems are reviewed and otherwise negative.    PHYSICAL EXAM:   VS:  There were no vitals taken for this visit.  BMI: There is no height or weight on file to calculate BMI. GEN: Well nourished, well developed, in no acute distress  HEENT: normocephalic, atraumatic Neck: no JVD, carotid bruits, or masses Cardiac: ***RRR; no murmurs, rubs, or gallops, no edema  Respiratory:  clear to auscultation bilaterally, normal work of breathing GI: soft, nontender, nondistended, + BS MS: no deformity or atrophy  Skin: warm and dry, no rash Neuro:  Alert and Oriented x 3, Strength and sensation are intact, follows commands Psych: euthymic mood, full affect  Wt Readings from Last 3 Encounters:  07/05/15 173 lb 6.4 oz (78.7 kg)  05/25/14 201 lb (91.2 kg)  11/05/13 235 lb 3.7 oz (106.7 kg)      Studies/Labs Reviewed:   EKG:  EKG was ordered today and personally reviewed by me and demonstrates *** EKG was not ordered today.***  Recent Labs: No results found for requested labs within last 8760 hours.   Lipid Panel    Component Value Date/Time   CHOL 119 05/18/2014 0758   TRIG 110 05/18/2014 0758   HDL 50 05/18/2014 0758   CHOLHDL 2.4 05/18/2014 0758   VLDL 22 05/18/2014 0758   LDLCALC 47 05/18/2014 0758    Additional studies/ records that were reviewed today include: Summarized above.***    ASSESSMENT & PLAN:   1. ***  Disposition: F/u with ***   Medication Adjustments/Labs and Tests Ordered: Current medicines are reviewed at length with the patient today.  Concerns regarding medicines are  outlined above. Medication changes, Labs and Tests ordered today are summarized above and listed in the Patient Instructions accessible in Encounters.   Signed, Laurann Montanaayna N Latressa Harries, PA-C  12/29/2016 7:55 AM    [facility] [address], [place], [place]  [address] Phone: [phone]; Fax: [phone]

## 2016-12-29 NOTE — Telephone Encounter (Signed)
S/w sharon at Dr. Rutherford NailHamerick's office , Deniece Reeyana Dunn, PA, is seeing this pt today as a new pt.  Received records but attached was someone else's record's. Will fax recent lab work.

## 2017-01-18 ENCOUNTER — Encounter: Payer: Self-pay | Admitting: *Deleted

## 2017-01-18 NOTE — Progress Notes (Deleted)
Cardiology Office Note    Date:  01/18/2017  ID:  Osie, Amparo 26-Dec-1955, MRN [id number] PCP:  Ailene Ravel, MD  Cardiologist:  Remotely Dr. Tenny Craw   Chief Complaint: passed out  History of Present Illness:  James Cabrera is a 61 y.o. male with history of HTN, prior DVT with recurrent PE 2013 (lifelong anticoagulation recommended), acid reflux, asthma, arthritis, RLS, hyperlipidemia, testicular hypofunction, recent anemia, CKD III varicose veins, dementia per PCP note who presents for evaluation of syncope at the request of Dr. Nathanial Rancher.   He was remotely seen by Dr. Tenny Craw for HTN. Per review of records from primary care, he had labs done 11/24/16 showing Hgb 11.3 (down from 14.6 in 04/2016 per notes), BUN 21, Cr 1.50 (previously 1.66 per notes), Na 131 (down from 133 previously) - it was felt perhaps lab changes were due to fluid overload and dilutional effect so Lasix was increased to 1 tablet daily instead of 1/2 daily. Recommendation regarding progressive anemia was to consider colonoscopy in the future). CT chest 12/07/16 done for cough and hyponatremia showed moderate calcification in the proximal left subclavian artery, atherosclerotic calcification in the aorta, foci of coronary artery calcification, stable 2mm nodular lesion RUL, hepatic steatosis. He was seen by Dr. Nathanial Rancher 12/21/16 wreporting an episode of chest pain followed by syncope. He also has had some dementia issues recently and brain CT showed diffuse atrophy and small vessel disease but no other significant abnormalities; neurology eval pending.Repeat labs*** 12/21/16 showed ----------------    Syncope  Coronary artery calcification Acute kidney injury superimposed on probable CKD stage III  H/o DVT/PE  Essential HTN Anemia of unknown etiology     Past Medical History:  Diagnosis Date  . Acid reflux   . Arthritis    legs, back   . Asthma   . CKD (chronic kidney disease), stage III   . Complication of  anesthesia    trouble breathing when I woke up & they had to put that thing on my face but then I was OK  . DVT (deep vein thrombosis) in pregnancy (HCC)   . History of stress test    done at [facility], post PE, told it was wnl  . Hypertension   . Hyponatremia   . Pneumonia    hosp. x2 for pneumonia   . Pulmonary embolism (HCC) 2013  . RLS (restless legs syndrome)   . Shortness of breath     Past Surgical History:  Procedure Laterality Date  . ANTERIOR FUSION CERVICAL SPINE  07/2016   [facility]  . KNEE ARTHROSCOPY Right    x2  . LUMBAR LAMINECTOMY/DECOMPRESSION MICRODISCECTOMY Left 11/11/2013   Procedure: LUMBAR LAMINECTOMY/DECOMPRESSION MICRODISCECTOMY 1 LEVEL,LUMBAR-FOUR-FIVE;  Surgeon: Reinaldo Meeker, MD;  Location: [facility];  Service: Neurosurgery;  Laterality: Left;  left    Current Medications: No outpatient prescriptions have been marked as taking for the 01/19/17 encounter (Appointment) with Laurann Montana, PA-C.     Allergies:   Penicillins   Social History   Social History  . Marital status: Married    Spouse name: N/A  . Number of children: N/A  . Years of education: N/A   Social History Main Topics  . Smoking status: Current Every Day Smoker    Packs/day: 0.30    Years: 37.00    Types: Cigarettes  . Smokeless tobacco: Never Used     Comment: 4 cigarettes daily for the past 3 days  . Alcohol use 0.0  oz/week     Comment: occas. - for holidays   . Drug use: No  . Sexual activity: Not on file   Other Topics Concern  . Not on file   Social History Narrative  . No narrative on file     Family History:  No family history on file. ***  ROS:   Please see the history of present illness. Otherwise, review of systems is positive for ***.  All other systems are reviewed and otherwise negative.    PHYSICAL EXAM:   VS:  There were no vitals taken for this visit.  BMI: There is no height or weight on file to calculate BMI. GEN: Well nourished, well  developed, in no acute distress  HEENT: normocephalic, atraumatic Neck: no JVD, carotid bruits, or masses Cardiac: ***RRR; no murmurs, rubs, or gallops, no edema  Respiratory:  clear to auscultation bilaterally, normal work of breathing GI: soft, nontender, nondistended, + BS MS: no deformity or atrophy  Skin: warm and dry, no rash Neuro:  Alert and Oriented x 3, Strength and sensation are intact, follows commands Psych: euthymic mood, full affect  Wt Readings from Last 3 Encounters:  07/05/15 173 lb 6.4 oz (78.7 kg)  05/25/14 201 lb (91.2 kg)  11/05/13 235 lb 3.7 oz (106.7 kg)      Studies/Labs Reviewed:   EKG:  EKG was ordered today and personally reviewed by me and demonstrates *** EKG was not ordered today.***  Recent Labs: No results found for requested labs within last 8760 hours.   Lipid Panel    Component Value Date/Time   CHOL 119 05/18/2014 0758   TRIG 110 05/18/2014 0758   HDL 50 05/18/2014 0758   CHOLHDL 2.4 05/18/2014 0758   VLDL 22 05/18/2014 0758   LDLCALC 47 05/18/2014 0758    Additional studies/ records that were reviewed today include: Summarized above.***    ASSESSMENT & PLAN:   1. ***  Disposition: F/u with ***   Medication Adjustments/Labs and Tests Ordered: Current medicines are reviewed at length with the patient today.  Concerns regarding medicines are outlined above. Medication changes, Labs and Tests ordered today are summarized above and listed in the Patient Instructions accessible in Encounters.   Signed, Laurann Montanaayna N Kato Wieczorek, PA-C  01/18/2017 5:06 PM    [facility] [address], [place], [place]  [address] Phone: [phone](336) [phone]; Fax: [phone]

## 2017-01-19 ENCOUNTER — Ambulatory Visit: Payer: BLUE CROSS/BLUE SHIELD | Admitting: Diagnostic Neuroimaging

## 2017-01-19 ENCOUNTER — Ambulatory Visit: Payer: BLUE CROSS/BLUE SHIELD | Admitting: Physician Assistant

## 2017-01-22 ENCOUNTER — Encounter: Payer: Self-pay | Admitting: Diagnostic Neuroimaging

## 2017-01-29 ENCOUNTER — Telehealth: Payer: Self-pay | Admitting: Physician Assistant

## 2017-01-29 NOTE — Telephone Encounter (Signed)
Received records from [facility] for appointment on 03/09/17 with Azalee CourseHao Meng, PA.  Records put with Hao's schedule on 03/09/17. lp

## 2017-02-16 ENCOUNTER — Encounter: Payer: Self-pay | Admitting: Diagnostic Neuroimaging

## 2017-03-05 DIAGNOSIS — IMO0002 Reserved for concepts with insufficient information to code with codable children: Secondary | ICD-10-CM | POA: Insufficient documentation

## 2017-03-05 DIAGNOSIS — G8929 Other chronic pain: Secondary | ICD-10-CM | POA: Insufficient documentation

## 2017-03-05 DIAGNOSIS — M549 Dorsalgia, unspecified: Secondary | ICD-10-CM

## 2017-03-09 ENCOUNTER — Ambulatory Visit: Payer: BLUE CROSS/BLUE SHIELD | Admitting: Physician Assistant

## 2017-03-09 ENCOUNTER — Ambulatory Visit: Payer: BLUE CROSS/BLUE SHIELD | Admitting: Diagnostic Neuroimaging

## 2017-03-09 NOTE — Progress Notes (Deleted)
Cardiology Office Note    Date:  03/09/2017   ID:  James Cabrera, DOB 09-01-1955, MRN [id number]  PCP:  Ailene Ravel, MD  Cardiologist:  ***   No chief complaint on file.   History of Present Illness:  James Cabrera is a 61 y.o. male with PMH of CKD stage III, h/o DVT/PE, HTN and RLS.     Past Medical History:  Diagnosis Date  . Acid reflux   . Anemia   . Arthritis    legs, back   . Asthma   . Chest pain   . CKD (chronic kidney disease), stage III   . Complication of anesthesia    trouble breathing when I woke up & they had to put that thing on my face but then I was OK  . DVT (deep vein thrombosis) in pregnancy (HCC)   . History of stress test    done at [facility], post PE, told it was wnl  . Hypertension   . Hypo-osmolality and hyponatremia   . Hyponatremia   . Pneumonia    hosp. x2 for pneumonia   . Pulmonary embolism (HCC) 2013  . RLS (restless legs syndrome)   . Shortness of breath   . Syncope and collapse     Past Surgical History:  Procedure Laterality Date  . ANTERIOR FUSION CERVICAL SPINE  07/2016   [facility]  . KNEE ARTHROSCOPY Right    x2  . LUMBAR LAMINECTOMY/DECOMPRESSION MICRODISCECTOMY Left 11/11/2013   Procedure: LUMBAR LAMINECTOMY/DECOMPRESSION MICRODISCECTOMY 1 LEVEL,LUMBAR-FOUR-FIVE;  Surgeon: Reinaldo Meeker, MD;  Location: [facility];  Service: Neurosurgery;  Laterality: Left;  left    Current Medications: Outpatient Medications Prior to Visit  Medication Sig Dispense Refill  . albuterol (PROVENTIL HFA;VENTOLIN HFA) 108 (90 BASE) MCG/ACT inhaler Inhale 1 puff into the lungs every 6 (six) hours as needed for wheezing or shortness of breath.    Marland Kitchen aspirin 81 MG tablet Take 81 mg by mouth daily.      Marland Kitchen atorvastatin (LIPITOR) 20 MG tablet Take 1 tablet (20 mg total) by mouth daily before breakfast. 30 tablet 11  . cyanocobalamin (,VITAMIN B-12,) 1000 MCG/ML injection Inject 1,000 mcg into the muscle every 30 (thirty) days.    .  diazepam (VALIUM) 5 MG tablet Take 5 mg by mouth daily.    Marland Kitchen docusate sodium (COLACE) 100 MG capsule Take 100 mg by mouth 2 (two) times daily.    . DULoxetine (CYMBALTA) 30 MG capsule Take by mouth.    . folic acid (FOLVITE) 1 MG tablet Take 1 tablet by mouth daily.    . furosemide (LASIX) 40 MG tablet Take 40 mg by mouth daily.    Marland Kitchen gabapentin (NEURONTIN) 300 MG capsule Take 300 mg by mouth 3 (three) times daily.    Marland Kitchen losartan (COZAAR) 100 MG tablet Take 100 mg by mouth daily.    Marland Kitchen losartan-hydrochlorothiazide (HYZAAR) 100-25 MG per tablet TAKE 1 TABLET BEFORE BREAKFAST 30 tablet 11  . Multiple Vitamin (MULTI-VITAMINS) TABS Take 1 tablet by mouth daily.    . Multiple Vitamin (MULTIVITAMIN) tablet Take 1 tablet by mouth daily.    . promethazine (PHENERGAN) 25 MG tablet Take 25 mg by mouth as needed.    . rivaroxaban (XARELTO) 20 MG TABS tablet Take 20 mg by mouth daily.    . tamsulosin (FLOMAX) 0.4 MG CAPS capsule Take 1 capsule by mouth daily.    Marland Kitchen testosterone cypionate (DEPOTESTOSTERONE CYPIONATE) 200 MG/ML injection Inject 200  mg into the muscle every 14 (fourteen) days.    . traZODone (DESYREL) 100 MG tablet Take 100 mg by mouth at bedtime.     No facility-administered medications prior to visit.      Allergies:   Penicillins   Social History   Social History  . Marital status: Married    Spouse name: N/A  . Number of children: N/A  . Years of education: N/A   Social History Main Topics  . Smoking status: Current Every Day Smoker    Packs/day: 0.30    Years: 37.00    Types: Cigarettes  . Smokeless tobacco: Never Used     Comment: 4 cigarettes daily for the past 3 days  . Alcohol use 0.0 oz/week     Comment: occas. - for holidays   . Drug use: No  . Sexual activity: Not on file   Other Topics Concern  . Not on file   Social History Narrative  . No narrative on file     Family History:  The patient's ***family history is not on file.   ROS:   Please see the  history of present illness.    ROS All other systems reviewed and are negative.   PHYSICAL EXAM:   VS:  There were no vitals taken for this visit.   GEN: Well nourished, well developed, in no acute distress  HEENT: normal  Neck: no JVD, carotid bruits, or masses Cardiac: ***RRR; no murmurs, rubs, or gallops,no edema  Respiratory:  clear to auscultation bilaterally, normal work of breathing GI: soft, nontender, nondistended, + BS MS: no deformity or atrophy  Skin: warm and dry, no rash Neuro:  Alert and Oriented x 3, Strength and sensation are intact Psych: euthymic mood, full affect  Wt Readings from Last 3 Encounters:  07/05/15 173 lb 6.4 oz (78.7 kg)  05/25/14 201 lb (91.2 kg)  11/05/13 235 lb 3.7 oz (106.7 kg)      Studies/Labs Reviewed:   EKG:  EKG is*** ordered today.  The ekg ordered today demonstrates ***  Recent Labs: No results found for requested labs within last 8760 hours.   Lipid Panel    Component Value Date/Time   CHOL 119 05/18/2014 0758   TRIG 110 05/18/2014 0758   HDL 50 05/18/2014 0758   CHOLHDL 2.4 05/18/2014 0758   VLDL 22 05/18/2014 0758   LDLCALC 47 05/18/2014 0758    Additional studies/ records that were reviewed today include:  ***    ASSESSMENT:    No diagnosis found.   PLAN:  In order of problems listed above:  1. ***    Medication Adjustments/Labs and Tests Ordered: Current medicines are reviewed at length with the patient today.  Concerns regarding medicines are outlined above.  Medication changes, Labs and Tests ordered today are listed in the Patient Instructions below. There are no Patient Instructions on file for this visit.   Ramond Dial, [place]  03/09/2017 1:21 PM    [facility] [address] [place], [place], [place]  [address] Phone: [phone]; Fax: [phone]

## 2017-07-24 DEATH — deceased
